# Patient Record
Sex: Female | Born: 1960 | Race: White | Hispanic: No | Marital: Married | Smoking: Never smoker
Health system: Southern US, Community
[De-identification: ages and names within clinical notes are randomized; demographics above are authoritative.]

## PROBLEM LIST (undated history)

## (undated) DIAGNOSIS — K219 Gastro-esophageal reflux disease without esophagitis: Secondary | ICD-10-CM

## (undated) DIAGNOSIS — G4700 Insomnia, unspecified: Secondary | ICD-10-CM

## (undated) DIAGNOSIS — E7800 Pure hypercholesterolemia, unspecified: Secondary | ICD-10-CM

## (undated) DIAGNOSIS — E079 Disorder of thyroid, unspecified: Secondary | ICD-10-CM

## (undated) HISTORY — DX: Disorder of thyroid, unspecified: E07.9

## (undated) HISTORY — PX: CHOLECYSTECTOMY: SHX55

## (undated) HISTORY — DX: Pure hypercholesterolemia, unspecified: E78.00

## (undated) HISTORY — PX: VAGINAL HYSTERECTOMY: SUR661

## (undated) HISTORY — DX: Gastro-esophageal reflux disease without esophagitis: K21.9

## (undated) HISTORY — PX: SINOSCOPY: SHX187

## (undated) HISTORY — PX: OTHER SURGICAL HISTORY: SHX169

## (undated) HISTORY — PX: OOPHORECTOMY: SHX86

## (undated) HISTORY — DX: Insomnia, unspecified: G47.00

## (undated) HISTORY — PX: APPENDECTOMY: SHX54

---

## 1998-06-29 ENCOUNTER — Other Ambulatory Visit: Admission: RE | Admit: 1998-06-29 | Discharge: 1998-06-29 | Payer: Self-pay

## 1999-09-07 ENCOUNTER — Encounter (INDEPENDENT_AMBULATORY_CARE_PROVIDER_SITE_OTHER): Payer: Self-pay | Admitting: *Deleted

## 1999-09-07 ENCOUNTER — Ambulatory Visit (HOSPITAL_BASED_OUTPATIENT_CLINIC_OR_DEPARTMENT_OTHER): Admission: RE | Admit: 1999-09-07 | Discharge: 1999-09-08 | Payer: Self-pay | Admitting: Otolaryngology

## 1999-10-04 ENCOUNTER — Ambulatory Visit: Admission: RE | Admit: 1999-10-04 | Discharge: 1999-10-04 | Payer: Self-pay | Admitting: Otolaryngology

## 2000-12-09 ENCOUNTER — Other Ambulatory Visit: Admission: RE | Admit: 2000-12-09 | Discharge: 2000-12-09 | Payer: Self-pay | Admitting: Gynecology

## 2000-12-10 ENCOUNTER — Encounter: Admission: RE | Admit: 2000-12-10 | Discharge: 2000-12-10 | Payer: Self-pay | Admitting: Gynecology

## 2000-12-10 ENCOUNTER — Encounter: Payer: Self-pay | Admitting: Gynecology

## 2001-01-22 ENCOUNTER — Encounter (INDEPENDENT_AMBULATORY_CARE_PROVIDER_SITE_OTHER): Payer: Self-pay

## 2001-01-22 ENCOUNTER — Observation Stay (HOSPITAL_COMMUNITY): Admission: RE | Admit: 2001-01-22 | Discharge: 2001-01-23 | Payer: Self-pay | Admitting: Gynecology

## 2003-12-22 ENCOUNTER — Other Ambulatory Visit: Admission: RE | Admit: 2003-12-22 | Discharge: 2003-12-22 | Payer: Self-pay | Admitting: Obstetrics and Gynecology

## 2005-04-27 ENCOUNTER — Inpatient Hospital Stay (HOSPITAL_COMMUNITY): Admission: RE | Admit: 2005-04-27 | Discharge: 2005-04-27 | Payer: Self-pay | Admitting: Obstetrics and Gynecology

## 2005-11-01 ENCOUNTER — Ambulatory Visit: Payer: Self-pay | Admitting: Family Medicine

## 2008-05-27 ENCOUNTER — Encounter: Admission: RE | Admit: 2008-05-27 | Discharge: 2008-05-27 | Payer: Self-pay | Admitting: Internal Medicine

## 2009-04-07 ENCOUNTER — Encounter: Admission: RE | Admit: 2009-04-07 | Discharge: 2009-04-07 | Payer: Self-pay | Admitting: Obstetrics and Gynecology

## 2010-09-05 ENCOUNTER — Ambulatory Visit (HOSPITAL_COMMUNITY): Admission: RE | Admit: 2010-09-05 | Discharge: 2010-09-05 | Payer: Self-pay | Admitting: Family Medicine

## 2011-03-08 NOTE — Op Note (Signed)
[name], Tyler NO.:  [id number]   MEDICAL RECORD NO.:  [id number]          PATIENT TYPE:  AMB   LOCATION:  SDC                           FACILITY:  [facility]   PHYSICIAN:  Randye Lobo, M.D.   DATE OF BIRTH:  1961-04-27   DATE OF PROCEDURE:  04/26/2005  DATE OF DISCHARGE:                                 OPERATIVE REPORT   PREOPERATIVE DIAGNOSIS:  1.  Dyspareunia.  2.  Genuine stress incontinence.  3.  Pelvic organ prolapse.   POSTOPERATIVE DIAGNOSIS:  1.  Dyspareunia.  2.  Pelvic adhesions.  3.  Genuine stress incontinence.  4.  Pelvic organ prolapse.   PROCEDURE:  Laparoscopy with lysis of adhesions, tension-free vaginal tape,  cystoscopy, anterior colporrhaphy.   SURGEON:  Randye Lobo, M.D.   ASSISTANT:  Carrington Clamp, M.D.   ANESTHESIA:  General endotracheal.   IV FLUIDS:  1500 mL Ringer's lactate.   ESTIMATED BLOOD LOSS:  75 mL.   URINE OUTPUT:  200 mL   COMPLICATIONS:  None.   INDICATIONS FOR PROCEDURE:  The patient is a 51 year old gravida 3, para 3  female, status post total vaginal hysterectomy for adenomyosis, status post  laparoscopic lysis of adhesions and ablation of endometriosis and status  post laparoscopic left salpingo-oophorectomy who presents with leakage of  urine and painful intercourse. The patient had evidence of genuine stress  incontinence with the cystometric testing performed in the office. The  patient had a normal pelvic ultrasound showing a normal right tube and ovary  during her preoperative evaluation. The patient wished for surgical  evaluation and treatment of the pelvic pain and the genuine stress  incontinence and a plan was made to proceed with laparoscopy with possible  lysis of adhesions and possible right salpingo-oophorectomy, transvaginal  tension-free vaginal tape, cystoscopy, and the anterior colporrhaphy after  risks, benefits, and alternatives were discussed with her.   FINDINGS:   Examination under anesthesia demonstrated a first degree  cystocele. The cervix and uterus were noted to be absent. There was a small  rectocele appreciated. The patient had good vaginal apical support.   At the time of laparoscopy, the uterus, left tube, left ovary were noted to  be absent. There was some scarring in the region where the previous vaginal  cuff had been closed and the omentum was adherent to this. The right tube  and ovary were noted to be completely free. The right fallopian tube was  normal. The right ovary had two small cysts. One was a 1.5 cm simple cyst,  and the other appeared to be corpus luteum cyst. There appeared to be  evidence of previous sutures in the cul-de-sac which were consistent with  the patient's prior surgeries.   The liver and the stomach organs appeared to be normal. The gallbladder was  not visualized. There were no adhesions in the upper abdomen.   Cystoscopy demonstrated the absence of a foreign body in either the bladder  or the urethra. The bladder was visualized throughout 360 degrees and had a  normal bladder dome and trigone. Both of the ureters were noted to  be patent  after the injection of indigo carmine dye.   SPECIMENS:  None.   PROCEDURE:  The patient was reidentified in the preoperative hold area. She  received both TED hose and PAS stockings for DVT prophylaxis. The patient  received Ancef 1 gram IV for antibiotic prophylaxis. The patient was then  taken down to the operating room where general endotracheal anesthesia was  induced. She was then placed in the dorsal lithotomy position and the  abdomen and the vagina were sterilely prepped. The patient was catheterized  with a red rubber catheter. She was then sterilely draped.   The procedure began with a 1 cm umbilical incision which was created with  the scalpel. The incision was then carried down to the fascia using an Allis  clamp. A 10 mm trocar was then inserted directly  into the peritoneal cavity  and the laparoscope confirmed proper placement. The pneumoperitoneum was  achieved and the patient was then placed in the Trendelenburg position. A 5  mm incision was then placed in the right and left lower quadrants and 5 mm  trocars were placed under direct visualization of laparoscope. An inspection  of the pelvic and abdominal organs was performed and the findings are as  noted above. The adhesions of the omentum to the vaginal cuff were sharply  lysed and the area was then irrigated and suctioned and found to be  hemostatic. A piece of intercede was placed over the vaginal cuff to prevent  further adhesion formation.   This concluded the patient's the laparoscopic surgery. The trocars were  removed under visualization of the laparoscope. The pneumoperitoneum was  released, and the laparoscope and the at 10 mm umbilical trocar were removed  simultaneously. The incisions were then closed with subcuticular sutures of  3-0 plain and sterile bandages were placed over these incisions.   The patient was then placed in the high lithotomy position and the  suburethral sling was performed next. A Foley catheter was placed inside the  bladder. A weighted speculum was placed inside the vagina. The anterior  vaginal wall was marked in the midline with Allis clamps. The mucosa was  then injected with 1% lidocaine with 1:200,000 of epinephrine. The vaginal  mucosa was then incised vertically with the scalpel. The dissection of the  vaginal mucosa off of the underlying subvaginal tissue was performed sharply  using the Metzenbaum scissors bilaterally. The dissection was carried back  to the level of the pubic rami bilaterally. Hemostasis was created during  the dissection using monopolar cautery in the area overlying the bladder.   The TVT sites were then marked suprapubically approximately 2.5 cm to the right and to the left of the midline. The suprapubic incisions were  created  sharply with a scalpel. The right suprapubic needle passer was placed first  in a top-down fashion. The incision was placed through the right suprapubic  incision and was guided up through the endopelvic fascia to the right of the  patient's urethra on the ipsilateral side. The same procedure that was  performed on the right-hand side was then repeated on the left-hand side  with the left TVT abdominal needle passer. The Foley catheter was removed at  this time and cystoscopy was performed and the findings were as noted above.  The bladder was then emptied of all cystoscopy fluid and the TVT sling was  attached to the needle passers. The sling was then drawn up through the  incision suprapubically. The cystoscope was  then again placed one final time  for evaluation of the bladder and there was no evidence of a foreign body in  the bladder or the urethra. Indigo carmine dye was visualized coming from  both the ureters after it had been injected intravenously. The cystoscope  was withdrawn and the bladder was completely emptied and a Foley catheter  was once again placed in the bladder. Final tension on the TVT was adjusted  and the excess sling material was excised suprapubically. Hemostasis was  noted to be good at this time and the anterior colporrhaphy was performed by  placing vertical mattress sutures of 0 Vicryl. Excess vaginal mucosa was  then trimmed away and the anterior vaginal wall was closed with interrupted  sutures of 2-0 Vicryl.   The suprapubic incisions were closed with Dermabond. This concluded the  patient's procedure. There were no complications to the procedure. All  needle, instrument, sponge counts were correct. The patient is escorted to  the recovery room in stable and awake condition.       BES/MEDQ  D:  04/26/2005  T:  04/26/2005  Job:  [id number]

## 2011-03-08 NOTE — Discharge Summary (Signed)
Lauren Tyler, Lauren Tyler NO.:  [id number]   MEDICAL RECORD NO.:  [id number]          PATIENT TYPE:  INP   LOCATION:  9311                          FACILITY:  [facility]   PHYSICIAN:  Randye Lobo, M.D.   DATE OF BIRTH:  November 28, 1960   DATE OF ADMISSION:  04/26/2005  DATE OF DISCHARGE:  04/27/2005                                 DISCHARGE SUMMARY   ADMISSION DIAGNOSES:  1.  Dyspareunia.  2.  Genuine stress incontinence.  3.  Pelvic organ prolapse.   DISCHARGE DIAGNOSES:  Status post laparoscopy with lysis of adhesions,  tension-free vaginal tape sling, cystoscopy, anterior colporrhaphy.   SIGNIFICANT OPERATIONS AND PROCEDURES:  The patient underwent a laparoscopy  with lysis of adhesions, tension-free vaginal tape suburethral sling and  cystoscopy with anterior colporrhaphy on April 26, 2005 at the [facility] [place] under the direction of Dr. Conley Simmonds and with the  assistance of Dr. Carrington Clamp.   HOSPITAL COURSE:  The patient is a 50 year old gravida 3, para 3, Caucasian  female, status post total vaginal hysterectomy, status post laparoscopic  lysis of adhesions and ablation of endometriosis, and status post  laparoscopic left salpingo-oophorectomy for an ovarian cyst who presented to  the office with leakage of urine and painful intercourse. The patient had  evidence of a first degree cystocele in the office. Multichannel urodynamic  testing confirmed the presence of genuine stress incontinence.   The patient wished for both evaluation of her dyspareunia in addition to the  treatment of her urinary incontinence.   The patient was admitted on April 26, 2005 at which time she underwent a  laparoscopy with lysis of adhesions, a tension-free vaginal tape suburethral  sling and cystoscopy with anterior colporrhaphy. The surgery was  uncomplicated. Findings at laparoscopy included omentum which was adherent  to the vaginal cuff. The patient had a  normal right ovary with a small 1.5  cm corpus luteum and also simple cyst. Cystoscopy demonstrated the absence  of foreign bodies in the bladder urethra. The bladder was visualized for 360  degrees and the ureters were patent bilaterally.   Postoperatively, the patient had an unremarkable surgical recovery. She had  postoperative nausea which was successfully treated with antiemetics. Her  pain was initially controlled with a morphine PCA and Toradol which was  successfully converted over to Percocet and ibuprofen on postoperative day  #1. The patient was able to tolerate a regular diet prior to her discharge.  She was able to ambulate independently. She did receive both PAS stockings  and TED hose for DVT prophylaxis intraoperative and postoperatively.  The  patient's suprapubic incisions remained clean, dry and intact. She had  minimal vaginal bleeding during the hospitalization.   On postoperative day #1, the patient was able to void 350 cc with a postvoid  residual of 32 cc.   Her discharge hemoglobin was 12.4 and she was tolerating this well.   The patient was found to be in good condition and ready for discharge on  postoperative day #1.   DISCHARGE INSTRUCTIONS:  1.  Discharged to home.  2.  The patient will take Percocet 5 mg/325 mg one to two p.o. q.4-6h.      p.r.n. pain, ibuprofen 600 mg p.o. q.6h. p.r.n. pain.  3.  The patient will follow a regular diet.  4.  The patient will have decreased activity for the next six weeks.  5.  The patient will follow up in the office in four weeks.  6.  The patient will call if she experiences problems with fever, nausea and      vomiting, pain uncontrolled by her medication, active vaginal bleeding,      difficulty emptying her bladder, or any other concern.      Randye Lobo, M.D.  Electronically Signed     BES/MEDQ  D:  06/06/2005  T:  06/06/2005  Job:  [id number]

## 2011-03-08 NOTE — H&P (Signed)
NAME:  Lauren Tyler, Lauren Tyler NO.:  [id number]   MEDICAL RECORD NO.:  [id number]          PATIENT TYPE:  AMB   LOCATION:  SDC                           [facility]ACILITY:  [facility]   PHYSICIAN:  Randye Lobo, M.D.   DATE OF BIRTH:  1961/03/03   DATE OF ADMISSION:  DATE OF DISCHARGE:                                HISTORY & PHYSICAL   CHIEF COMPLAINT:  1.  Urinary incontinence.  2.  Painful intercourse.   HISTORY OF PRESENT ILLNESS:  The patient is a 50 year old, gravida 3, para  26, Caucasian female, status post total vaginal hysterectomy in 1995 for  adenomyosis, status post laparoscopic lysis of adhesions and ablation of  endometriosis in 1991, and status post laparoscopic left salpingo-  oophorectomy for a complex ovarian cyst in 2002, who presents with leakage  of urine and painful intercourse. The patient reports that her leakage of  urine occurs with coughing, laughing, and intercourse. She also reports pain  with intercourse and she would like surgical treatment of both of these. The  patient has had simple cystometrics performed in the office and her bladder  was filled to a maximum of 300 mL. There was no evidence of detrusor  instability. The patient did have leakage of urine with standing and a  Valsalva maneuver. The patient would like to keep her right ovary if it  appears normal laparoscopically.   PAST OBSTETRIC AND GYNECOLOGIC HISTORY:  1.  Three prior vaginal deliveries.  2.  Status post total vaginal hysterectomy in 1995.  3.  Status post laparoscopic lysis of adhesions and ablation of      endometriosis in 1991.  4.  Status post laparoscopic left salpingo-oophorectomy for a complex mass      which proved to be a corpus luteum cyst and adnexal adhesions.  5.  Normal Pap smear in March of 2005.  6.  Normal mammogram in April of 2006.   PAST MEDICAL HISTORY:  1.  Cirrhosis.  2.  Sleep apnea. The patient does not use a CPAP machine.  3.  Bursitis of the right  hip.  4.  Irritable bowel syndrome.  5.  Recent diagnosis of diverticulitis in March of 2006.   PAST SURGICAL HISTORY:  1.  Status post total vaginal hysterectomy.  2.  Status post laparoscopic lysis of adhesions and ablation of      endometriosis.  3.  Status post laparoscopic left salpingo-oophorectomy.  4.  Status post appendectomy.   MEDICATIONS:  1.  Nexium 40 mg p.o. daily.  2.  Aleve p.r.n.   ALLERGIES:  No known drug allergies.   SOCIAL HISTORY:  The patient is an Airline pilot. She denies the use of  tobacco, alcohol, or illicit drugs.   PHYSICAL EXAMINATION:  VITAL SIGNS:  Blood pressure is 140/90.  GENERAL:  The patient is a Caucasian female in no acute distress.  LUNGS:  Clear to auscultation bilaterally.  HEART:  S1, S2 with a regular rate and rhythm.  ABDOMEN:  Soft and nontender and without evidence of hepatosplenomegaly or  organomegaly.  PELVIC:  Normal external genitalia and urethra. There is a  first degree  cystocele, a first degree rectocele, and good vaginal apical support. The  cervix and uterus are noted to be absent. No midline or adnexal masses are  appreciated.   IMPRESSION:  The patient is a 50 year old, gravida 3, para 3 female with  dyspareunia, a history of endometriosis and pelvic adhesive disease, and  genuine stress incontinence.   PLAN:  The patient will undergo a laparoscopic lysis of adhesions with  possible right salpingo-oophorectomy, tension free vaginal tape procedure,  cystoscopy, and anterior colporrhaphy at the [facility] [place]  on April 26, 2005. The risks, benefits, and alternatives had been discussed  with the patient who wishes to proceed.       BES/MEDQ  D:  04/25/2005  T:  04/25/2005  Job:  [id number]

## 2011-03-08 NOTE — Op Note (Signed)
[facility]  Patient:    Lauren Tyler, Lauren Tyler                    MRN: [id number] Proc. Date: 01/22/01 Attending:  Luvenia Redden, M.D.                           Operative Report  PREOPERATIVE DIAGNOSIS:       Cystic pelvic mass.  POSTOPERATIVE DIAGNOSES:      1. Cystic pelvic mass.                               2. Pelvic adhesions.                               3. Small hydrosalpinx on the right.  OPERATIONS:                   1. Laparoscopy.                               2. Lysis of adhesions.                               3. Left salpingo-oophorectomy.  SURGEON:                      Luvenia Redden, M.D.  ASSISTANT:                    Miguel Aschoff, M.D.  DESCRIPTION OF PROCEDURE:     Under good anesthesia, the patient was prepped and draped in a sterile manner.  The bladder was catheterized with a Foley catheter.  A transverse incision at the lower border of the umbilicus was made.  A Veress needle was inserted intraperitoneally.  Placement was confirmed by a negative pressure reading with upward traction of the abdominal wall.  Pneumoperitoneum was formed with carbon dioxide and the needle was removed.  The laparoscopic trocar and cannula were introduced.  The trocar removed and the operative laparoscope introduced.  Under direct vision, an avascular site in the midline suprapubically was chosen for a 5 mm operative trocar and cannula.  This was introduced.  A blunt probe was introduced through this cannula.  The intestines were mobilized out of the pelvis.  The right ovary was normal size and shape and was freely movable.  There was one small area of hydrosalpinx in the end of the tube.  It was freely movable and was really of no consequence.  There was no evidence of endometriosis.  The uterus, of course, was missing due to previous hysterectomy.  On the right side, there was a cystic mass in the ovary that was about 4 cm.  For the most part,  the ovary was freely movable.  The surface was shiny down next to the vagina.  That ______ area was adherent to the peritoneum.  Two more operative ports were introduced, both in the lower quadrants through an avascular site by first transilluminating the abdominal wall to miss the vessels.  The 5 mm cannula in the midline was replaced with a 10/12 mm cannula.  The infundibulopelvic ligament was grasped right at the insertion to the ovary and tented  up.  The ureter was viewed down in the pelvis well below the operative site.  Using the tripolar forceps, the infundibulopelvic ligament was grasped, cauterized to 0 in two places and incised so as to free up the ovary from its attachment to the ligament.  This was done in two bites.  Dissection was carried down into the mesentery by cauterizing and cutting it.  Once we got down to the level of the broad ligament, there were some adhesions of the ovary to the peritoneum.  These adhesions was lysed by sharp and blunt dissection until the specimen could be freed up entirely.  During dissection, the cyst that was present in the ovary was ruptured.  There was clear fluid in it.  The cyst wall internally was smooth and there were no excrescences or internal structures.  Once the ovary was freed up, there were a few small bleeders in the bed where it was adherent.  These were electrocoagulated with the bipolar forceps.  The area was irrigated copiously with lactated Ringers and then aspirated.  Both ureters were viewed again before finishing the procedure.  They were both seen to peristalse normally.  The specimen was put into an Endobag and brought out through the midline puncture site.  The area was again irrigated.  Small individual bleeders right on the vaginal apex were electrocoagulated.  Under reduced pressure, there was no active bleeding in the operative site.  The procedure was then terminated.  The pneumoperitoneum was released and the  puncture sites were viewed under reduced pressure and there was no bleeding from these sites.  The fascia was closed in the midline incision suprapubically with 0 Vicryl, two sutures.  The other incisions were closed subcuticularly with 2-0 plain catgut suture.  Band-aids were applied. Total operating time was approximately two hours due to the tedious dissection of getting the adherent ovary away from the peritoneum.  Estimated blood loss was less than 50 cc.  None was replaced.  The patient had clear urine coming from her Foley at the end of the procedure.  She was removed to recovery in good condition. DD:  01/21/99 TD:  01/22/01 Job: [id number]

## 2011-04-21 DEATH — deceased

## 2011-05-22 ENCOUNTER — Other Ambulatory Visit: Payer: Self-pay | Admitting: Obstetrics and Gynecology

## 2011-05-28 ENCOUNTER — Other Ambulatory Visit: Payer: Self-pay | Admitting: Obstetrics and Gynecology

## 2011-05-28 DIAGNOSIS — R928 Other abnormal and inconclusive findings on diagnostic imaging of breast: Secondary | ICD-10-CM

## 2011-06-04 ENCOUNTER — Ambulatory Visit
Admission: RE | Admit: 2011-06-04 | Discharge: 2011-06-04 | Disposition: A | Discharge status: Home / Self Care | Payer: 59 | Source: Ambulatory Visit | Attending: Obstetrics and Gynecology | Admitting: Obstetrics and Gynecology

## 2011-06-04 DIAGNOSIS — R928 Other abnormal and inconclusive findings on diagnostic imaging of breast: Secondary | ICD-10-CM

## 2011-09-17 ENCOUNTER — Other Ambulatory Visit: Payer: Self-pay | Admitting: Obstetrics and Gynecology

## 2011-09-17 DIAGNOSIS — N63 Unspecified lump in unspecified breast: Secondary | ICD-10-CM

## 2011-09-27 ENCOUNTER — Ambulatory Visit
Admission: RE | Admit: 2011-09-27 | Discharge: 2011-09-27 | Disposition: A | Discharge status: Home / Self Care | Payer: 59 | Source: Ambulatory Visit | Attending: Obstetrics and Gynecology | Admitting: Obstetrics and Gynecology

## 2011-09-27 DIAGNOSIS — N63 Unspecified lump in unspecified breast: Secondary | ICD-10-CM

## 2012-02-17 ENCOUNTER — Other Ambulatory Visit: Payer: Self-pay | Admitting: Obstetrics and Gynecology

## 2012-02-17 DIAGNOSIS — N63 Unspecified lump in unspecified breast: Secondary | ICD-10-CM

## 2012-03-26 ENCOUNTER — Ambulatory Visit
Admission: RE | Admit: 2012-03-26 | Discharge: 2012-03-26 | Disposition: A | Discharge status: Home / Self Care | Payer: 59 | Source: Ambulatory Visit | Attending: Obstetrics and Gynecology | Admitting: Obstetrics and Gynecology

## 2012-03-26 DIAGNOSIS — N63 Unspecified lump in unspecified breast: Secondary | ICD-10-CM

## 2012-05-28 ENCOUNTER — Other Ambulatory Visit: Payer: Self-pay | Admitting: Internal Medicine

## 2012-05-28 DIAGNOSIS — D249 Benign neoplasm of unspecified breast: Secondary | ICD-10-CM

## 2012-05-28 DIAGNOSIS — Z1231 Encounter for screening mammogram for malignant neoplasm of breast: Secondary | ICD-10-CM

## 2012-06-08 ENCOUNTER — Ambulatory Visit: Payer: 59

## 2012-06-12 ENCOUNTER — Ambulatory Visit
Admission: RE | Admit: 2012-06-12 | Discharge: 2012-06-12 | Disposition: A | Discharge status: Home / Self Care | Payer: 59 | Source: Ambulatory Visit | Attending: Internal Medicine | Admitting: Internal Medicine

## 2012-06-12 DIAGNOSIS — D249 Benign neoplasm of unspecified breast: Secondary | ICD-10-CM

## 2013-06-15 ENCOUNTER — Other Ambulatory Visit: Payer: Self-pay

## 2014-07-20 ENCOUNTER — Other Ambulatory Visit: Payer: Self-pay

## 2014-07-21 LAB — CYTOLOGY - PAP

## 2015-04-03 ENCOUNTER — Other Ambulatory Visit: Payer: Self-pay | Admitting: Pathology

## 2015-07-26 ENCOUNTER — Other Ambulatory Visit: Payer: Self-pay | Admitting: Obstetrics and Gynecology

## 2015-07-26 DIAGNOSIS — N6320 Unspecified lump in the left breast, unspecified quadrant: Principal | ICD-10-CM

## 2015-07-26 DIAGNOSIS — N6325 Unspecified lump in the left breast, overlapping quadrants: Secondary | ICD-10-CM

## 2015-08-02 ENCOUNTER — Other Ambulatory Visit: Payer: Self-pay

## 2015-08-09 ENCOUNTER — Other Ambulatory Visit: Payer: Self-pay

## 2015-08-16 ENCOUNTER — Ambulatory Visit
Admission: RE | Admit: 2015-08-16 | Discharge: 2015-08-16 | Disposition: A | Discharge status: Home / Self Care | Payer: BLUE CROSS/BLUE SHIELD | Source: Ambulatory Visit | Attending: Obstetrics and Gynecology | Admitting: Obstetrics and Gynecology

## 2015-08-16 DIAGNOSIS — N6320 Unspecified lump in the left breast, unspecified quadrant: Principal | ICD-10-CM

## 2015-08-16 DIAGNOSIS — N6325 Unspecified lump in the left breast, overlapping quadrants: Secondary | ICD-10-CM

## 2018-06-16 ENCOUNTER — Ambulatory Visit (INDEPENDENT_AMBULATORY_CARE_PROVIDER_SITE_OTHER): Payer: PRIVATE HEALTH INSURANCE | Admitting: Allergy

## 2018-06-16 ENCOUNTER — Encounter: Payer: Self-pay | Admitting: Allergy

## 2018-06-16 VITALS — BP 114/72 | HR 92 | Temp 98.5°F | Resp 20 | Ht 67.0 in | Wt 233.6 lb

## 2018-06-16 DIAGNOSIS — J310 Chronic rhinitis: Secondary | ICD-10-CM | POA: Diagnosis not present

## 2018-06-16 DIAGNOSIS — J3089 Other allergic rhinitis: Secondary | ICD-10-CM | POA: Diagnosis not present

## 2018-06-16 MED ORDER — IPRATROPIUM BROMIDE 0.06 % NA SOLN: NASAL | 5 refills | Status: AC

## 2018-06-16 NOTE — Progress Notes (Signed)
New Patient Note  RE: Lauren Tyler MRN: [id number] DOB: 1960/11/04 Date of Office Visit: 06/16/2018  Referring provider: Angelina Sheriff, MD Primary care provider: Ronita Hipps, MD  Chief Complaint: allergies  History of present illness: Lauren Tyler is a 57 y.o. female presenting today for consultation for rhinitis.   She states she was told by her PCP that she needs to do something about her allergies.  She has had year-round allergy symptoms her "whole life".  She reports post-nasal drip, sneezing, nasal congestion, ear itchiness.  Spring is worse for her but symptoms are year-round.  She has taken [facility] allergy and sinus medication mostly in the spring or when symptoms are severe like when she feels sinus pressure.  She states she is supposed to use flonase but states has not been using like she should but states it has helped with congestion.  She states she did have allergy testing by bloodwork done by ENT about 1 year or so ago that was negative.  She denies having sinus infections.  She does report having sinus surgery about 25 years ago where she has a deviated septum repair and sinuses cleaned out.    She reports when she eats bread (mostly yeast/white bread not wheat) she has increased nasal drainage and cough.  This has been ongoing for years now and does not remember when this started.   She had endoscopy and colonoscopy by GI about 2 weeks ago and reports she required "stretching" of her esophagus.   No history of asthma.  She does have a history of psoriasis that was stress-induced.   She does take pantaprozole daily for reflux.    Review of systems: Review of Systems  Constitutional: Negative for chills, fever and malaise/fatigue.  HENT: Positive for congestion. Negative for ear discharge, ear pain, nosebleeds and sore throat.   Eyes: Negative for pain, discharge and redness.  Respiratory: Negative for cough, sputum production, shortness of breath and  wheezing.   Cardiovascular: Negative for chest pain.  Gastrointestinal: Negative for abdominal pain, constipation, diarrhea, heartburn, nausea and vomiting.  Musculoskeletal: Negative for joint pain.  Skin: Negative for itching and rash.  Neurological: Negative for headaches.    All other systems negative unless noted above in HPI  Past medical history: Past Medical History:  Diagnosis Date  . GERD (gastroesophageal reflux disease)   . Hypercholesteremia   . Insomnia   . Thyroid disorder     Past surgical history: Past Surgical History:  Procedure Laterality Date  . APPENDECTOMY    . CHOLECYSTECTOMY    . OOPHORECTOMY Left   . pelvic floor reconstruction    . SINOSCOPY    . VAGINAL HYSTERECTOMY      Family history:  Family History  Problem Relation Age of Onset  . Diabetes Father   . Congestive Heart Failure Father   . Hypertension Father   . Hypertension Maternal Grandfather   . Hypertension Paternal Grandmother   . Diabetes Paternal Grandmother   . Hypertension Paternal Grandfather   . Allergic rhinitis Neg Hx   . Asthma Neg Hx     Social history: Lives in a home with carpeting with gas heating and window cooling.  Dogs and cat in the home.  No concern for roaches in the home.  She works as a Dance movement psychotherapist.  She denies smoking history.    Medication List: Allergies as of 06/16/2018   No Known Allergies     Medication List  Accurate as of 06/16/18  4:59 PM. Always use your most recent med list.          amitriptyline 25 MG tablet Commonly known as:  ELAVIL TAKE 1 TABLET BY MOUTH EVERY DAY AT NIGHT   amLODipine 5 MG tablet Commonly known as:  NORVASC Take 5 mg by mouth daily.   CALCIUM + D PO Take by mouth.   cyclobenzaprine 5 MG tablet Commonly known as:  FLEXERIL cyclobenzaprine 5 mg tablet  TAKE 1 (ONE) TABLET AT BEDTIME FOR JAW CLENCHING   FLUoxetine 10 MG capsule Commonly known as:  PROZAC Take 10 mg by mouth daily.   fluticasone 50  MCG/ACT nasal spray Commonly known as:  FLONASE fluticasone propionate 50 mcg/actuation nasal spray,suspension  USE 2 SPRAYS BY NASAL ROUTE DAILY FOR 30 DAYS.   losartan 50 MG tablet Commonly known as:  COZAAR Take 50 mg by mouth daily.   meloxicam 15 MG tablet Commonly known as:  MOBIC TAKE 1 TABLET BY MOUTH EVERY DAY. REPLACES DICLOFENAC   pantoprazole 40 MG tablet Commonly known as:  PROTONIX Take 40 mg by mouth daily.   simvastatin 20 MG tablet Commonly known as:  ZOCOR Take 20 mg by mouth daily.   VYVANSE 70 MG capsule Generic drug:  lisdexamfetamine Take 70 mg by mouth daily.   zolpidem 5 MG tablet Commonly known as:  AMBIEN TAKE 1 TABLET BY MOUTH AT BEDTIME       Known medication allergies: No Known Allergies   Physical examination: Blood pressure 114/72, pulse 92, temperature 98.5 F (36.9 C), temperature source Oral, resp. rate 20, height 5\' 7"  (1.702 m), weight 233 lb 9.6 oz (106 kg).  General: Alert, interactive, in no acute distress. HEENT: PERRLA, TMs pearly gray, turbinates moderately edematous with clear discharge, post-pharynx non erythematous. Neck: Supple without lymphadenopathy. Lungs: Clear to auscultation without wheezing, rhonchi or rales. {no increased work of breathing. CV: Normal S1, S2 without murmurs. Abdomen: Nondistended, nontender. Skin: Warm and dry, without lesions or rashes. Extremities:  No clubbing, cyanosis or edema. Neuro:   Grossly intact.  Diagnositics/Labs:  Allergy testing: environmental allergy skin prick testing is positive to meadow fescue grass pollen, mugwort weed pollen, elm tree pollen and mouse.  Intradermal testing is positive to mold mix 1,2,3,4 and cat. Yeast skin prick testing is negative Allergy testing results were read and interpreted by provider, documented by clinical staff.   Assessment and plan:   Allergic rhinitis   - environmental allergy skin testing today is positive to grass pollen, weed  pollen, tree pollen, mouse, cat and molds   - allergen avoidance measures discussed/handouts provided   - take long-acting antihistamine like Zyrtec 10mg , Xyzal 5mg  or Allegra 180mg  daily   - trial dymista 1 spray each nostril twice a day.  This is a combination nasal spray with Flonase + Astelin (nasal antihistamine).  This helps with both nasal congestion and drainage. If your insurance does not cover this spray will prescribe Astelin separately for you.    Gustatory rhinitis   - some foods can cause increased nasal congestion and drainage following ingestion.    - yeast IgE skin testing today is negative thus you are not allergic to yeast   - nasal atrovent can be helpful in decrease/stopping symptoms related to gustatory rhinitis.  Can use nasal atrovent 2 sprays each nostril either before bread ingestion to prevent symptoms or afterwards to control symptoms.    Follow-up 6 months or sooner if needed  I appreciate  the opportunity to take part in [place] care. Please do not hesitate to contact me with questions.  Sincerely,   Prudy Feeler, MD Allergy/Immunology [facility]

## 2018-06-16 NOTE — Patient Instructions (Addendum)
Allergic rhinitis   - environmental allergy skin testing today is positive to grass pollen, weed pollen, tree pollen, mouse, cat and molds   - allergen avoidance measures discussed/handouts provided   - take long-acting antihistamine like Zyrtec 10mg , Xyzal 5mg  or Allegra 180mg  daily   - trial dymista 1 spray each nostril twice a day.  This is a combination nasal spray with Flonase + Astelin (nasal antihistamine).  This helps with both nasal congestion and drainage. If your insurance does not cover this spray will prescribe Astelin separately for you.    Gustatory rhinitis   - some foods can cause increased nasal congestion and drainage following ingestion.    - yeast IgE skin testing today is negative thus you are not allergic to yeast   - nasal atrovent can be helpful in decrease/stopping symptoms related to gustatory rhinitis.  Can use nasal atrovent 2 sprays each nostril either before bread ingestion to prevent symptoms or afterwards to control symptoms.    Follow-up 6 months or sooner if needed

## 2018-07-22 ENCOUNTER — Other Ambulatory Visit: Payer: Self-pay | Admitting: *Deleted

## 2018-07-22 ENCOUNTER — Telehealth: Payer: Self-pay | Admitting: Allergy

## 2018-07-22 MED ORDER — AZELASTINE-FLUTICASONE 137-50 MCG/ACT NA SUSP: NASAL | 5 refills | Status: AC

## 2018-07-22 NOTE — Telephone Encounter (Signed)
Beverlie would like [facility] [place].  She states she really liked it from the sample given by Dr.  Nelva Bush.

## 2018-07-22 NOTE — Telephone Encounter (Signed)
RX sent

## 2018-11-27 ENCOUNTER — Other Ambulatory Visit: Payer: Self-pay

## 2019-04-29 ENCOUNTER — Other Ambulatory Visit: Payer: Self-pay | Admitting: Obstetrics and Gynecology

## 2019-04-29 DIAGNOSIS — N6320 Unspecified lump in the left breast, unspecified quadrant: Secondary | ICD-10-CM

## 2019-05-07 ENCOUNTER — Other Ambulatory Visit: Payer: Self-pay | Admitting: Obstetrics and Gynecology

## 2019-05-07 ENCOUNTER — Ambulatory Visit
Admission: RE | Admit: 2019-05-07 | Discharge: 2019-05-07 | Disposition: A | Discharge status: Home / Self Care | Payer: PRIVATE HEALTH INSURANCE | Source: Ambulatory Visit | Attending: Obstetrics and Gynecology | Admitting: Obstetrics and Gynecology

## 2019-05-07 ENCOUNTER — Ambulatory Visit
Admission: RE | Admit: 2019-05-07 | Discharge: 2019-05-07 | Disposition: A | Discharge status: Home / Self Care | Payer: BLUE CROSS/BLUE SHIELD | Source: Ambulatory Visit | Attending: Obstetrics and Gynecology | Admitting: Obstetrics and Gynecology

## 2019-05-07 DIAGNOSIS — N6320 Unspecified lump in the left breast, unspecified quadrant: Secondary | ICD-10-CM

## 2019-06-18 ENCOUNTER — Other Ambulatory Visit: Payer: Self-pay

## 2019-09-26 ENCOUNTER — Other Ambulatory Visit: Payer: Self-pay | Admitting: Allergy

## 2019-10-03 ENCOUNTER — Other Ambulatory Visit: Payer: Self-pay | Admitting: Allergy

## 2020-10-26 DIAGNOSIS — R079 Chest pain, unspecified: Secondary | ICD-10-CM

## 2021-05-14 IMAGING — US ULTRASOUND LEFT BREAST LIMITED
1 series · 2 of 2 positions shown · non-contrast
Comparison: 11/17/2018 and earlier

CLINICAL DATA: Palpable abnormality in the LEFT breast was noted 3
weeks ago.

EXAM:
DIGITAL DIAGNOSTIC LEFT MAMMOGRAM WITH CAD AND TOMO
ULTRASOUND LEFT BREAST

[Series 1: ultrasound left breast limited · 0.06mm/px · 2 of 2 slices shown]
[im 1/2]
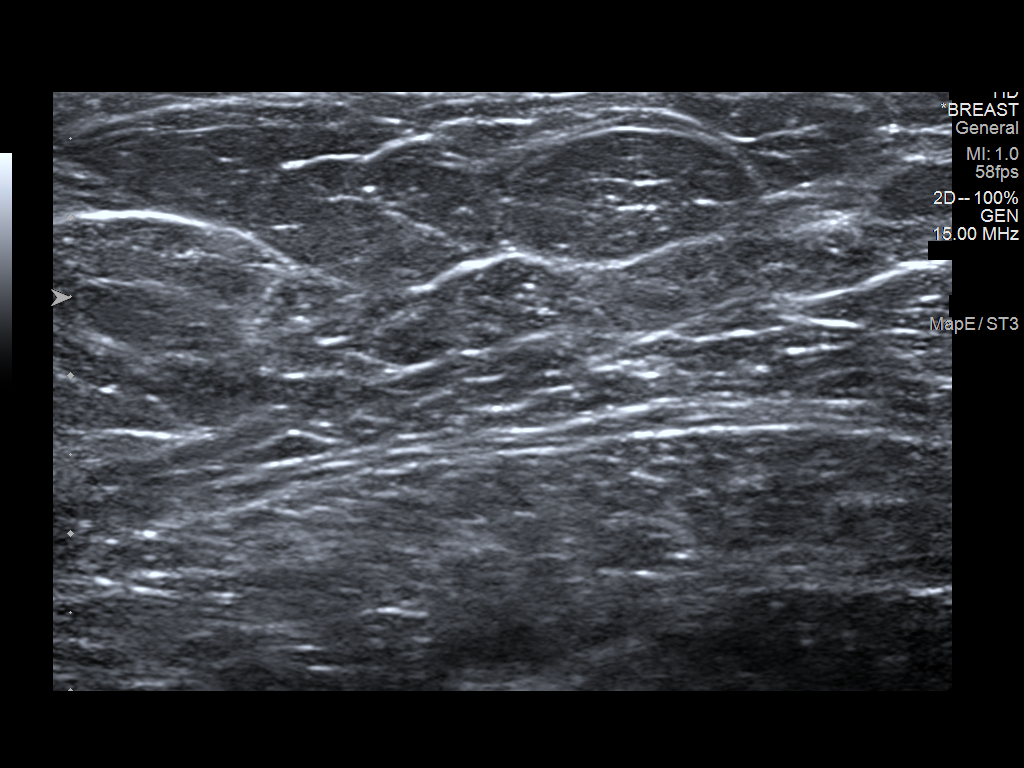
[im 2/2]
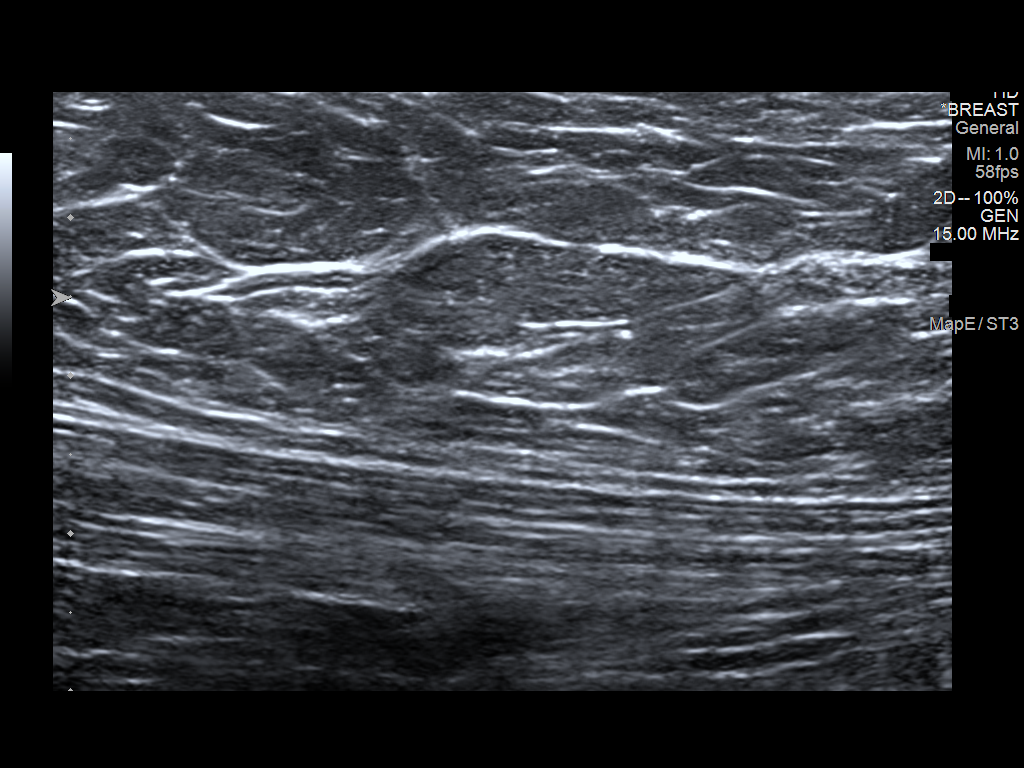

[2 of 2 positions shown; findings below may reference images not displayed]

ACR Breast Density Category b: There are scattered areas of
fibroglandular density.
FINDINGS: No suspicious mass, distortion, or microcalcifications are
identified to suggest presence of malignancy. Spot tangential view
in the area of patient's concern in the UPPER-OUTER QUADRANT of the
LEFT breast shows normal appearing fibrofatty tissue.

Mammographic images were processed with CAD.

On physical exam, I palpate no discrete mass in the UPPER portion of
the LEFT breast in the area concern to the patient. There is no
erythema or visible abnormality in the breast.

Targeted ultrasound is performed, showing normal appearing
fibrofatty tissue in the UPPER portion of the LEFT breast. Insert
present
IMPRESSION: No mammographic or ultrasound evidence for malignancy.

RECOMMENDATION:
Screening mammogram is recommended October 2019.

I have discussed the findings and recommendations with the patient.
Results were also provided in writing at the conclusion of the
visit. If applicable, a reminder letter will be sent to the patient
regarding the next appointment.

BI-RADS CATEGORY  1: Negative.

## 2021-05-14 IMAGING — MG DIGITAL DIAGNOSTIC UNILATERAL LEFT MAMMOGRAM WITH TOMO AND CAD
6 series · 6 of 18 positions shown · non-contrast
Comparison: 11/17/2018 and earlier

CLINICAL DATA: Palpable abnormality in the LEFT breast was noted 3
weeks ago.

EXAM:
DIGITAL DIAGNOSTIC LEFT MAMMOGRAM WITH CAD AND TOMO
ULTRASOUND LEFT BREAST

[L MLO synth-2D (1 of 2)]
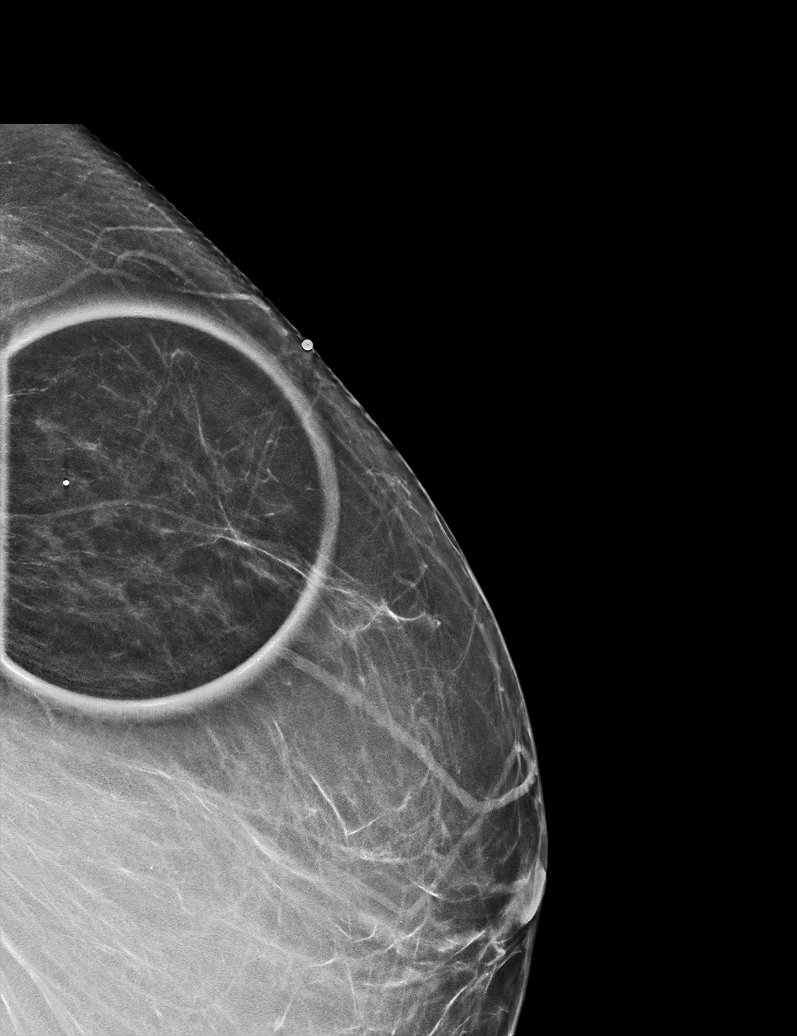

[L MLO synth-2D (2 of 2)]
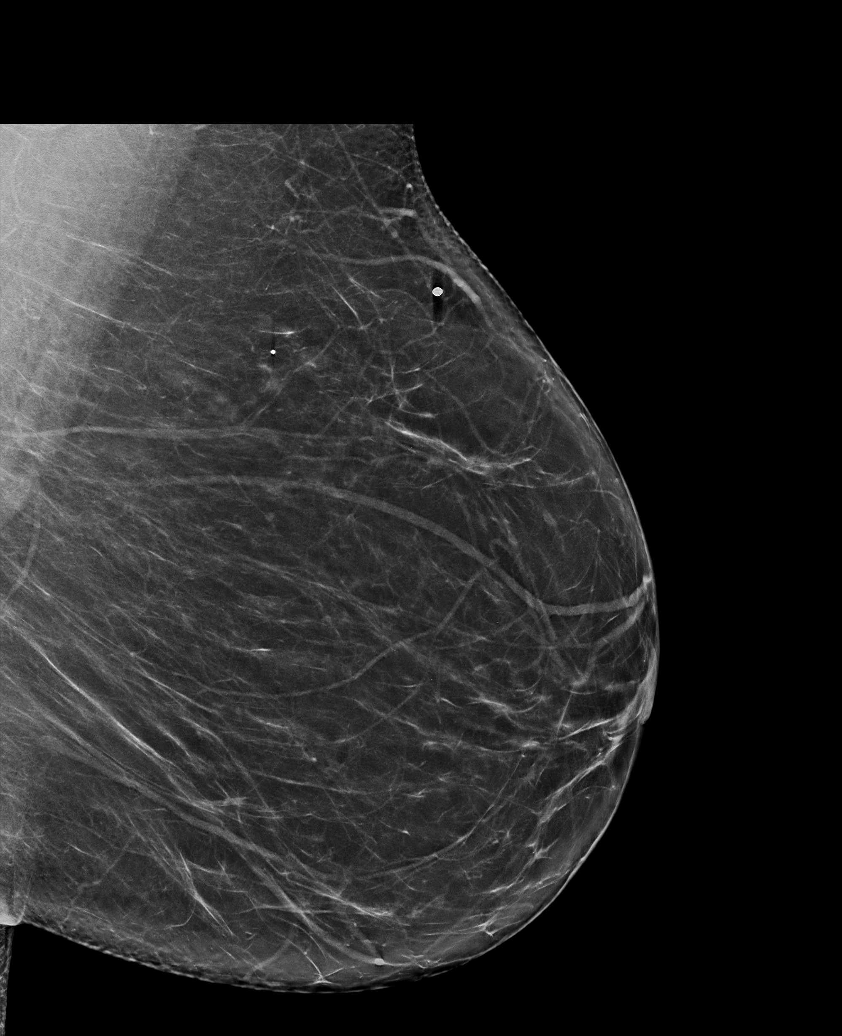

[L CC synth-2D]
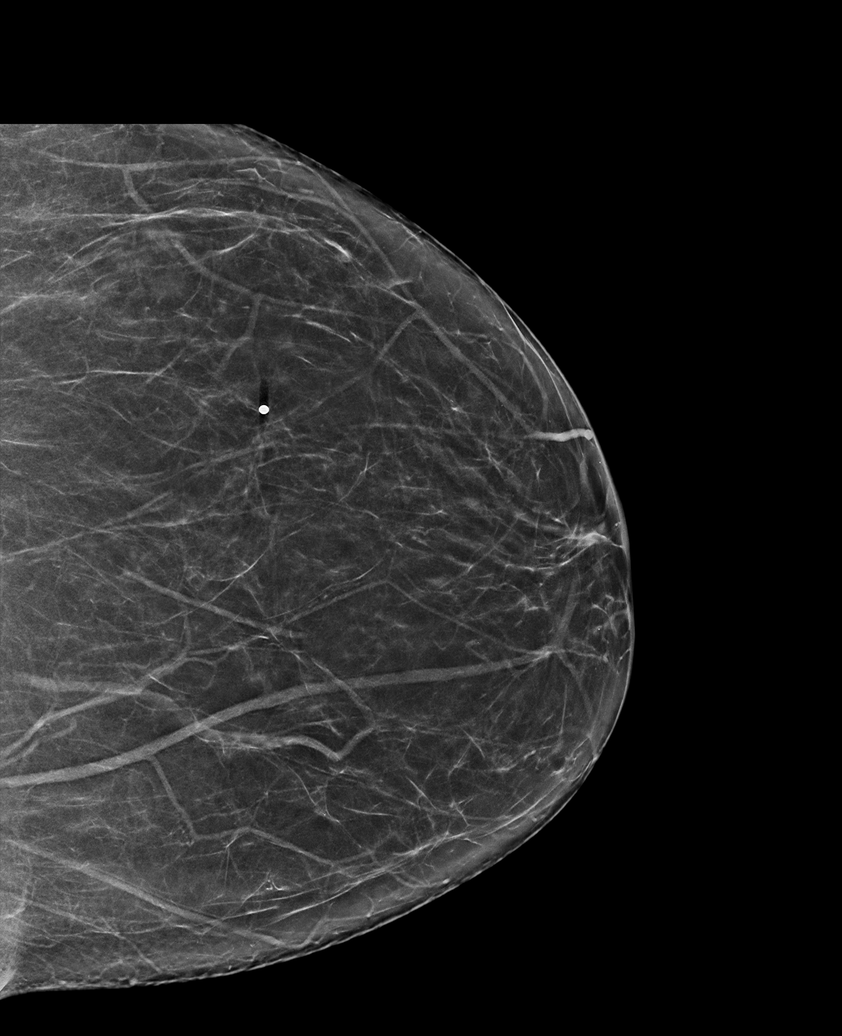

[L MLO tomo (1 of 2) · tomo slice 39/78.0]
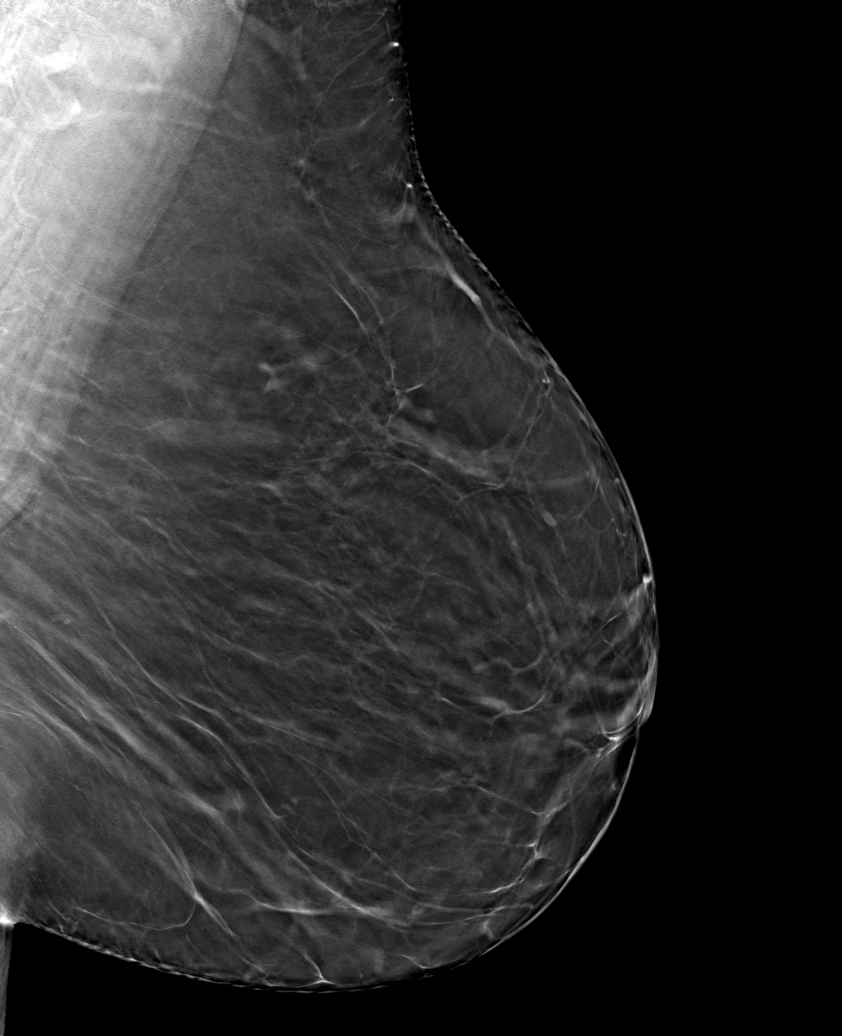

[L CC tomo · tomo slice 35/68.0]
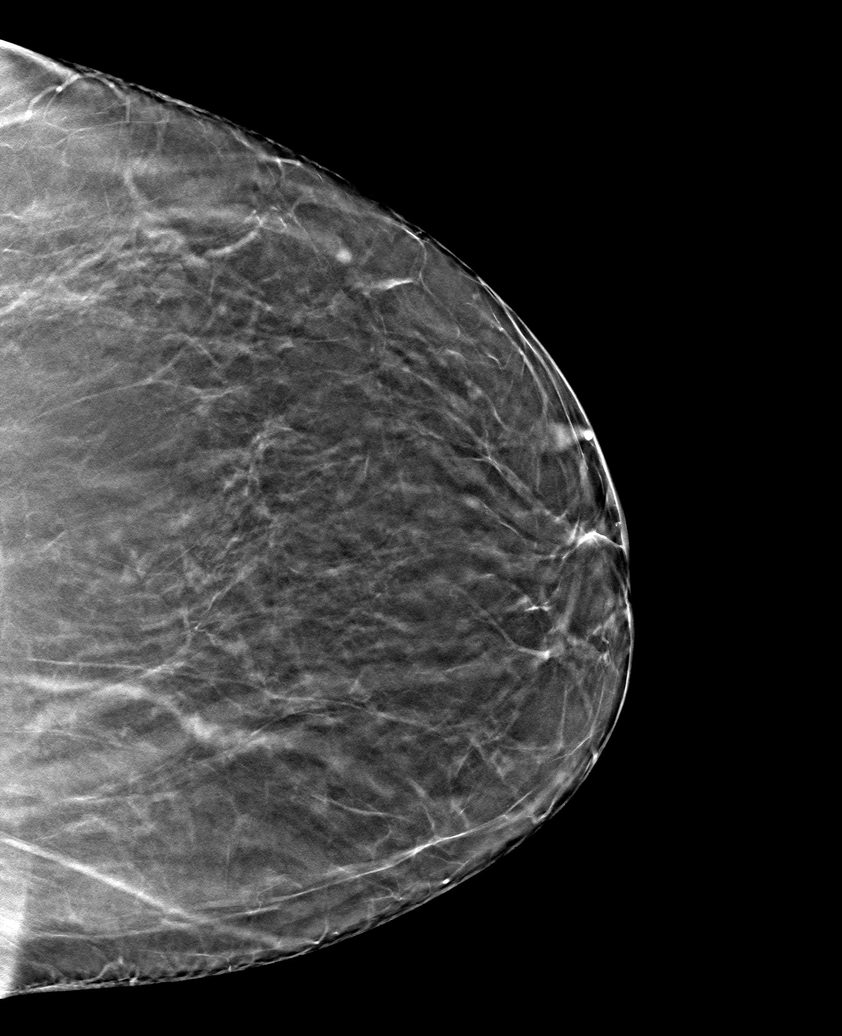

[L MLO tomo (2 of 2) · tomo slice 31/62.0]
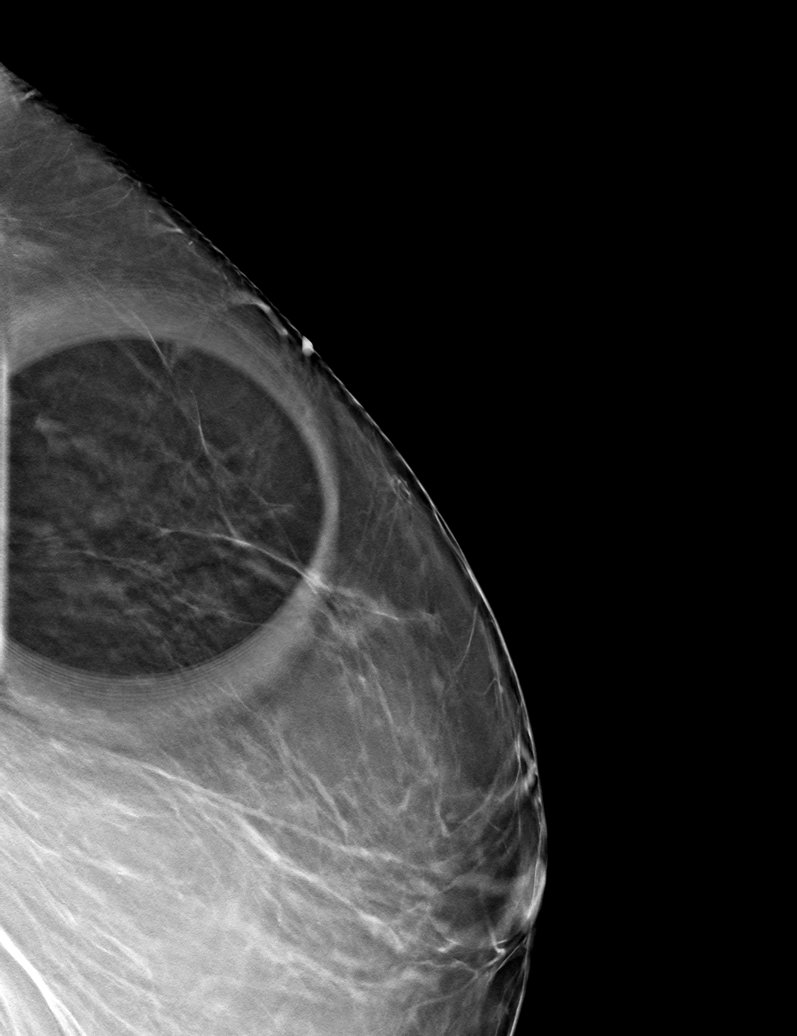

[6 of 18 positions shown; findings below may reference images not displayed]

ACR Breast Density Category b: There are scattered areas of
fibroglandular density.
FINDINGS: No suspicious mass, distortion, or microcalcifications are
identified to suggest presence of malignancy. Spot tangential view
in the area of patient's concern in the UPPER-OUTER QUADRANT of the
LEFT breast shows normal appearing fibrofatty tissue.

Mammographic images were processed with CAD.

On physical exam, I palpate no discrete mass in the UPPER portion of
the LEFT breast in the area concern to the patient. There is no
erythema or visible abnormality in the breast.

Targeted ultrasound is performed, showing normal appearing
fibrofatty tissue in the UPPER portion of the LEFT breast. Insert
present
IMPRESSION: No mammographic or ultrasound evidence for malignancy.

RECOMMENDATION:
Screening mammogram is recommended October 2019.

I have discussed the findings and recommendations with the patient.
Results were also provided in writing at the conclusion of the
visit. If applicable, a reminder letter will be sent to the patient
regarding the next appointment.

BI-RADS CATEGORY  1: Negative.

## 2022-06-28 ENCOUNTER — Emergency Department: Payer: PRIVATE HEALTH INSURANCE

## 2022-06-28 ENCOUNTER — Inpatient Hospital Stay
Admission: EM | Admit: 2022-06-28 | Discharge: 2022-06-30 | DRG: 603 | Disposition: A | Discharge status: Home / Self Care | Payer: PRIVATE HEALTH INSURANCE | Attending: Osteopathic Medicine | Admitting: Osteopathic Medicine

## 2022-06-28 ENCOUNTER — Encounter: Payer: Self-pay | Admitting: Emergency Medicine

## 2022-06-28 ENCOUNTER — Other Ambulatory Visit: Payer: Self-pay

## 2022-06-28 DIAGNOSIS — Z79899 Other long term (current) drug therapy: Secondary | ICD-10-CM

## 2022-06-28 DIAGNOSIS — F32A Depression, unspecified: Secondary | ICD-10-CM | POA: Diagnosis present

## 2022-06-28 DIAGNOSIS — L0390 Cellulitis, unspecified: Secondary | ICD-10-CM | POA: Diagnosis present

## 2022-06-28 DIAGNOSIS — Z9049 Acquired absence of other specified parts of digestive tract: Secondary | ICD-10-CM

## 2022-06-28 DIAGNOSIS — E079 Disorder of thyroid, unspecified: Secondary | ICD-10-CM | POA: Diagnosis present

## 2022-06-28 DIAGNOSIS — L03211 Cellulitis of face: Secondary | ICD-10-CM | POA: Diagnosis not present

## 2022-06-28 DIAGNOSIS — F458 Other somatoform disorders: Secondary | ICD-10-CM | POA: Diagnosis present

## 2022-06-28 DIAGNOSIS — I10 Essential (primary) hypertension: Secondary | ICD-10-CM | POA: Diagnosis not present

## 2022-06-28 DIAGNOSIS — G4700 Insomnia, unspecified: Secondary | ICD-10-CM | POA: Diagnosis present

## 2022-06-28 DIAGNOSIS — Z833 Family history of diabetes mellitus: Secondary | ICD-10-CM

## 2022-06-28 DIAGNOSIS — F419 Anxiety disorder, unspecified: Secondary | ICD-10-CM | POA: Diagnosis present

## 2022-06-28 DIAGNOSIS — E785 Hyperlipidemia, unspecified: Secondary | ICD-10-CM | POA: Diagnosis present

## 2022-06-28 DIAGNOSIS — D72829 Elevated white blood cell count, unspecified: Secondary | ICD-10-CM | POA: Diagnosis present

## 2022-06-28 DIAGNOSIS — E7800 Pure hypercholesterolemia, unspecified: Secondary | ICD-10-CM | POA: Diagnosis present

## 2022-06-28 DIAGNOSIS — G44209 Tension-type headache, unspecified, not intractable: Secondary | ICD-10-CM | POA: Diagnosis present

## 2022-06-28 DIAGNOSIS — Z90710 Acquired absence of both cervix and uterus: Secondary | ICD-10-CM

## 2022-06-28 DIAGNOSIS — Z8249 Family history of ischemic heart disease and other diseases of the circulatory system: Secondary | ICD-10-CM

## 2022-06-28 DIAGNOSIS — E782 Mixed hyperlipidemia: Secondary | ICD-10-CM

## 2022-06-28 DIAGNOSIS — K219 Gastro-esophageal reflux disease without esophagitis: Secondary | ICD-10-CM | POA: Diagnosis present

## 2022-06-28 DIAGNOSIS — D72828 Other elevated white blood cell count: Secondary | ICD-10-CM

## 2022-06-28 LAB — CBC WITH DIFFERENTIAL/PLATELET
Abs Immature Granulocytes: 0.07 10*3/uL (ref 0.00–0.07)
Basophils Absolute: 0 10*3/uL (ref 0.0–0.1)
Basophils Relative: 0 %
Eosinophils Absolute: 0.1 10*3/uL (ref 0.0–0.5)
Eosinophils Relative: 1 %
HCT: 42 % (ref 36.0–46.0)
Hemoglobin: 13.9 g/dL (ref 12.0–15.0)
Immature Granulocytes: 0 %
Lymphocytes Relative: 13 %
Lymphs Abs: 2.1 10*3/uL (ref 0.7–4.0)
MCH: 29.4 pg (ref 26.0–34.0)
MCHC: 33.1 g/dL (ref 30.0–36.0)
MCV: 88.8 fL (ref 80.0–100.0)
Monocytes Absolute: 1 10*3/uL (ref 0.1–1.0)
Monocytes Relative: 6 %
Neutro Abs: 13.4 10*3/uL — ABNORMAL HIGH (ref 1.7–7.7)
Neutrophils Relative %: 80 %
Platelets: 308 10*3/uL (ref 150–400)
RBC: 4.73 MIL/uL (ref 3.87–5.11)
RDW: 14.2 % (ref 11.5–15.5)
WBC: 16.7 10*3/uL — ABNORMAL HIGH (ref 4.0–10.5)
nRBC: 0 % (ref 0.0–0.2)

## 2022-06-28 LAB — COMPREHENSIVE METABOLIC PANEL
ALT: 29 U/L (ref 0–44)
AST: 37 U/L (ref 15–41)
Albumin: 4.2 g/dL (ref 3.5–5.0)
Alkaline Phosphatase: 110 U/L (ref 38–126)
Anion gap: 10 (ref 5–15)
BUN: 17 mg/dL (ref 8–23)
CO2: 24 mmol/L (ref 22–32)
Calcium: 9.5 mg/dL (ref 8.9–10.3)
Chloride: 105 mmol/L (ref 98–111)
Creatinine, Ser: 0.86 mg/dL (ref 0.44–1.00)
GFR, Estimated: >60 mL/min (ref >60)
Glucose, Bld: 102 mg/dL — ABNORMAL HIGH (ref 70–99)
Potassium: 3.7 mmol/L (ref 3.5–5.1)
Sodium: 139 mmol/L (ref 135–145)
Total Bilirubin: 0.6 mg/dL (ref 0.3–1.2)
Total Protein: 8.1 g/dL (ref 6.5–8.1)

## 2022-06-28 LAB — URINALYSIS, ROUTINE W REFLEX MICROSCOPIC
Bilirubin Urine: NEGATIVE
Glucose, UA: NEGATIVE mg/dL
Hgb urine dipstick: NEGATIVE
Ketones, ur: NEGATIVE mg/dL
Leukocytes,Ua: NEGATIVE
Nitrite: NEGATIVE
Protein, ur: NEGATIVE mg/dL
Specific Gravity, Urine: 1.015 (ref 1.005–1.030)
pH: 6 (ref 5.0–8.0)

## 2022-06-28 LAB — LACTIC ACID, PLASMA
Lactic Acid, Venous: 1.4 mmol/L (ref 0.5–1.9)
Lactic Acid, Venous: 1.5 mmol/L (ref 0.5–1.9)

## 2022-06-28 MED ORDER — SENNOSIDES-DOCUSATE SODIUM 8.6-50 MG PO TABS: 1.0000 | ORAL_TABLET | Freq: Every evening | ORAL | Status: DC | PRN

## 2022-06-28 MED ORDER — SODIUM CHLORIDE 0.9 % IV BOLUS
1000.0000 mL | Freq: Once | INTRAVENOUS | Status: AC
  Administered 2022-06-28: 1000 mL via INTRAVENOUS

## 2022-06-28 MED ORDER — MORPHINE SULFATE (PF) 2 MG/ML IV SOLN
2.0000 mg | INTRAVENOUS | Status: DC | PRN
  Administered 2022-06-28: 2 mg via INTRAVENOUS
  Filled 2022-06-28: qty 1

## 2022-06-28 MED ORDER — ACETAMINOPHEN 500 MG PO TABS: 1000.0000 mg | ORAL_TABLET | Freq: Once | ORAL | Status: AC

## 2022-06-28 MED ORDER — ONDANSETRON HCL 4 MG/2ML IJ SOLN: 4.0000 mg | Freq: Four times a day (QID) | INTRAMUSCULAR | Status: DC | PRN

## 2022-06-28 MED ORDER — PANTOPRAZOLE SODIUM 40 MG PO TBEC
40.0000 mg | DELAYED_RELEASE_TABLET | Freq: Every day | ORAL | Status: DC
  Administered 2022-06-28 – 2022-06-30 (×3): 40 mg via ORAL
  Filled 2022-06-28 (×3): qty 1

## 2022-06-28 MED ORDER — SODIUM CHLORIDE 0.9 % IV SOLN
2.0000 g | Freq: Three times a day (TID) | INTRAVENOUS | Status: DC
  Administered 2022-06-28 – 2022-06-29 (×4): 2 g via INTRAVENOUS
  Filled 2022-06-28 (×6): qty 12.5

## 2022-06-28 MED ORDER — HEPARIN SODIUM (PORCINE) 5000 UNIT/ML IJ SOLN: 5000.0000 [IU] | Freq: Three times a day (TID) | INTRAMUSCULAR | Status: DC

## 2022-06-28 MED ORDER — SIMVASTATIN 10 MG PO TABS
20.0000 mg | ORAL_TABLET | Freq: Every day | ORAL | Status: DC
  Administered 2022-06-28 – 2022-06-29 (×2): 20 mg via ORAL
  Filled 2022-06-28 (×2): qty 2

## 2022-06-28 MED ORDER — LIDOCAINE-PRILOCAINE 2.5-2.5 % EX CREA
TOPICAL_CREAM | Freq: Once | CUTANEOUS | Status: AC
Start: 2022-06-28 — End: 2022-06-28
  Filled 2022-06-28: qty 5

## 2022-06-28 MED ORDER — LACTATED RINGERS IV BOLUS (SEPSIS): 1000.0000 mL | Freq: Once | INTRAVENOUS | Status: DC

## 2022-06-28 MED ORDER — LISDEXAMFETAMINE DIMESYLATE 50 MG PO CAPS
50.0000 mg | ORAL_CAPSULE | Freq: Every day | ORAL | Status: DC
  Filled 2022-06-28 (×2): qty 1

## 2022-06-28 MED ORDER — AMLODIPINE BESYLATE 5 MG PO TABS
5.0000 mg | ORAL_TABLET | Freq: Every day | ORAL | Status: DC
  Administered 2022-06-29 – 2022-06-30 (×2): 5 mg via ORAL
  Filled 2022-06-28 (×2): qty 1

## 2022-06-28 MED ORDER — ENOXAPARIN SODIUM 60 MG/0.6ML IJ SOSY
0.5000 mg/kg | PREFILLED_SYRINGE | Freq: Every day | INTRAMUSCULAR | Status: DC
  Administered 2022-06-28: 50 mg via SUBCUTANEOUS
  Filled 2022-06-28: qty 0.6

## 2022-06-28 MED ORDER — ZOLPIDEM TARTRATE 5 MG PO TABS
10.0000 mg | ORAL_TABLET | Freq: Every evening | ORAL | Status: DC | PRN
  Administered 2022-06-28 – 2022-06-29 (×2): 10 mg via ORAL
  Filled 2022-06-28 (×2): qty 2

## 2022-06-28 MED ORDER — OXYCODONE-ACETAMINOPHEN 5-325 MG PO TABS
1.0000 | ORAL_TABLET | Freq: Four times a day (QID) | ORAL | Status: AC | PRN
  Administered 2022-06-28 – 2022-06-29 (×4): 1 via ORAL
  Filled 2022-06-28 (×4): qty 1

## 2022-06-28 MED ORDER — CYCLOBENZAPRINE HCL 10 MG PO TABS
5.0000 mg | ORAL_TABLET | Freq: Every day | ORAL | Status: DC
  Administered 2022-06-28 – 2022-06-29 (×2): 5 mg via ORAL
  Filled 2022-06-28 (×2): qty 1

## 2022-06-28 MED ORDER — PIPERACILLIN-TAZOBACTAM 3.375 G IVPB 30 MIN
3.3750 g | Freq: Once | INTRAVENOUS | Status: AC
  Administered 2022-06-28: 3.375 g via INTRAVENOUS
  Filled 2022-06-28: qty 50

## 2022-06-28 MED ORDER — ENOXAPARIN SODIUM 40 MG/0.4ML IJ SOSY: 40.0000 mg | PREFILLED_SYRINGE | Freq: Every day | INTRAMUSCULAR | Status: DC

## 2022-06-28 MED ORDER — FLUOXETINE HCL 10 MG PO CAPS
10.0000 mg | ORAL_CAPSULE | Freq: Every day | ORAL | Status: DC
  Administered 2022-06-29 – 2022-06-30 (×2): 10 mg via ORAL
  Filled 2022-06-28 (×2): qty 1

## 2022-06-28 MED ORDER — ALBUTEROL SULFATE (2.5 MG/3ML) 0.083% IN NEBU: 3.0000 mL | INHALATION_SOLUTION | RESPIRATORY_TRACT | Status: DC | PRN

## 2022-06-28 MED ORDER — ONDANSETRON HCL 4 MG PO TABS: 4.0000 mg | ORAL_TABLET | Freq: Four times a day (QID) | ORAL | Status: DC | PRN

## 2022-06-28 MED ORDER — ACETAMINOPHEN 500 MG PO TABS
ORAL_TABLET | ORAL | Status: AC
  Administered 2022-06-28: 1000 mg
  Filled 2022-06-28: qty 1

## 2022-06-28 MED ORDER — VANCOMYCIN HCL 2000 MG/400ML IV SOLN
2000.0000 mg | Freq: Once | INTRAVENOUS | Status: AC
  Administered 2022-06-28: 2000 mg via INTRAVENOUS
  Filled 2022-06-28: qty 400

## 2022-06-28 MED ORDER — HYDRALAZINE HCL 10 MG PO TABS: 10.0000 mg | ORAL_TABLET | Freq: Four times a day (QID) | ORAL | Status: DC | PRN

## 2022-06-28 MED ORDER — ACETAMINOPHEN 650 MG RE SUPP: 650.0000 mg | Freq: Four times a day (QID) | RECTAL | Status: DC | PRN

## 2022-06-28 MED ORDER — ACETAMINOPHEN 325 MG PO TABS
650.0000 mg | ORAL_TABLET | Freq: Four times a day (QID) | ORAL | Status: DC | PRN
  Administered 2022-06-28 – 2022-06-30 (×4): 650 mg via ORAL
  Filled 2022-06-28 (×4): qty 2

## 2022-06-28 MED ORDER — LOSARTAN POTASSIUM 50 MG PO TABS
75.0000 mg | ORAL_TABLET | Freq: Every day | ORAL | Status: DC
  Administered 2022-06-29 – 2022-06-30 (×2): 75 mg via ORAL
  Filled 2022-06-28 (×2): qty 2

## 2022-06-28 MED ORDER — IOHEXOL 300 MG/ML  SOLN
75.0000 mL | Freq: Once | INTRAMUSCULAR | Status: AC | PRN
  Administered 2022-06-28: 75 mL via INTRAVENOUS

## 2022-06-28 MED ORDER — VANCOMYCIN HCL IN DEXTROSE 1-5 GM/200ML-% IV SOLN
1000.0000 mg | Freq: Two times a day (BID) | INTRAVENOUS | Status: DC
  Administered 2022-06-28 – 2022-06-29 (×2): 1000 mg via INTRAVENOUS
  Filled 2022-06-28 (×4): qty 200

## 2022-06-28 NOTE — Assessment & Plan Note (Signed)
-   Fluoxetine 10 mg daily resumed

## 2022-06-28 NOTE — ED Triage Notes (Signed)
Pt via POV c/o left facial swelling/abscess since 9/5 am. Martin Majestic to dr and was prescribed amoxicillin; returned after 4 doses w/o improvement and was switched to clindamycin oral abx. Pt has taken 5 doses of clindamycin with no improvement in symptoms. Pt present with left-sided swelling and pain, redness to upper lip new since last night, extreme photosensitivity, nausea with 2 episodes of emesis yesterday (none today), and headache behind eyes. No dizziness, afebrile on arrival. Significant PMH includes IBS and HTN controlled with oral meds.

## 2022-06-28 NOTE — Assessment & Plan Note (Signed)
-   Resume zolpidem 10 mg nightly as needed for sleep

## 2022-06-28 NOTE — ED Provider Notes (Addendum)
[facility] Provider Note    Event Date/Time   First MD Initiated Contact with Patient 06/28/22 1046     (approximate)   History   Cellulitis   HPI  Lauren Tyler is a 61 y.o. female   Past medical history of history of hypercholesterolemia, who presents with facial infection that has been unresponsive to oral antibiotics.  Started earlier this week with a pimple inside of her left nasal passage with some redness and swelling that has progressed despite being on amoxicillin and transition to clindamycin.  Subjective fevers and chills.  Also has photophobia to the left eye.    History was obtained via patient and daughter at bedside      Physical Exam   Triage Vital Signs: ED Triage Vitals  Enc Vitals Group     BP 06/28/22 1037 (!) 118/48     Pulse Rate 06/28/22 1037 (!) 104     Resp 06/28/22 1037 18     Temp 06/28/22 1037 98 F (36.7 C)     Temp Source 06/28/22 1037 Oral     SpO2 06/28/22 1037 96 %     Weight 06/28/22 1038 225 lb (102.1 kg)     Height 06/28/22 1038 '5\' 7"'$  (1.702 m)     Head Circumference --      Peak Flow --      Pain Score 06/28/22 1042 7     Pain Loc --      Pain Edu? --      Excl. in [place]? --     Most recent vital signs: Vitals:   06/28/22 1230 06/28/22 1430  BP: 114/77 129/84  Pulse: 84 81  Resp: 17 20  Temp: 98.7 F (37.1 C)   SpO2: 96% 97%    General: Awake, no distress.  CV:  Good peripheral perfusion.  Resp:  Normal effort.  Abd:  No distention.  Other:  She has a small lesion to the anterior left nares which is draining purulence, scant, with some surrounding cellulitic changes on the upper lip and left cheek.  There is no oral involvement in her posterior oropharynx appears normal without masses and phonation is normal, maintaining secretions, no neck masses, no submandibular swelling.  nonToxic appearance.   ED Results / Procedures / Treatments   Labs (all labs ordered are listed, but only  abnormal results are displayed) Labs Reviewed  CBC WITH DIFFERENTIAL/PLATELET - Abnormal; Notable for the following components:      Result Value   WBC 16.7 (*)    Neutro Abs 13.4 (*)    All other components within normal limits  URINALYSIS, ROUTINE W REFLEX MICROSCOPIC - Abnormal; Notable for the following components:   Color, Urine YELLOW (*)    APPearance CLEAR (*)    All other components within normal limits  COMPREHENSIVE METABOLIC PANEL - Abnormal; Notable for the following components:   Glucose, Bld 102 (*)    All other components within normal limits  CULTURE, BLOOD (ROUTINE X 2)  CULTURE, BLOOD (ROUTINE X 2)  LACTIC ACID, PLASMA  LACTIC ACID, PLASMA  HIV ANTIBODY (ROUTINE TESTING W REFLEX)     I reviewed labs and they are notable for white blood cell count of 16.7  EKG  ED ECG REPORT I, Lucillie Garfinkel, the attending physician, personally viewed and interpreted this ECG.   Date: 06/28/2022  EKG Time: 1220  Rate: 82  Rhythm: normal EKG, normal sinus rhythm, unchanged from previous tracings  Axis: normal  Intervals: No ischemic changes   RADIOLOGY I independently reviewed and interpreted CT maxillofacial with contrast and see no drainable collection.   PROCEDURES:  Critical Care performed: No  Procedures   MEDICATIONS ORDERED IN ED: Medications  lactated ringers bolus 1,000 mL ( Intravenous Canceled Entry 06/28/22 1203)  heparin injection 5,000 Units (has no administration in time range)  senna-docusate (Senokot-S) tablet 1 tablet (has no administration in time range)  ondansetron (ZOFRAN) tablet 4 mg (has no administration in time range)    Or  ondansetron (ZOFRAN) injection 4 mg (has no administration in time range)  acetaminophen (TYLENOL) tablet 650 mg (has no administration in time range)    Or  acetaminophen (TYLENOL) suppository 650 mg (has no administration in time range)  vancomycin (VANCOCIN) IVPB 1000 mg/200 mL premix (has no administration in  time range)  ceFEPIme (MAXIPIME) 2 g in sodium chloride 0.9 % 100 mL IVPB (has no administration in time range)  oxyCODONE-acetaminophen (PERCOCET/ROXICET) 5-325 MG per tablet 1 tablet (has no administration in time range)  morphine (PF) 2 MG/ML injection 2 mg (has no administration in time range)  lidocaine-prilocaine (EMLA) cream ( Topical Given 06/28/22 1202)  piperacillin-tazobactam (ZOSYN) IVPB 3.375 g (0 g Intravenous Stopped 06/28/22 1235)  vancomycin (VANCOREADY) IVPB 2000 mg/400 mL (2,000 mg Intravenous New Bag/Given 06/28/22 1233)  acetaminophen (TYLENOL) tablet 1,000 mg (1,000 mg Oral Given 06/28/22 1148)  sodium chloride 0.9 % bolus 1,000 mL (0 mLs Intravenous Stopped 06/28/22 1259)  iohexol (OMNIPAQUE) 300 MG/ML solution 75 mL (75 mLs Intravenous Contrast Given 06/28/22 1319)    Consultants:  I spoke with hospitalist regarding admission and regarding care plan for this patient.   IMPRESSION / MDM / ASSESSMENT AND PLAN / ED COURSE  I reviewed the triage vital signs and the nursing notes.                              Differential diagnosis includes, but is not limited to, facial cellulitis, facial abscess sepsis, less likely [place]    The patient is on the cardiac monitor to evaluate for evidence of arrhythmia and/or significant heart rate changes.  MDM: Work-up as above significant for leukocytosis, no fever, hemodynamics appropriate and stable, with CT scan no drainable abscess.  Gave IV antibiotics vancomycin and Zosyn  Failure of p.o. antibiotics, will admit for IV antibiotics.   Patient's presentation is most consistent with acute presentation with potential threat to life or bodily function.       FINAL CLINICAL IMPRESSION(S) / ED DIAGNOSES   Final diagnoses:  Facial cellulitis     Rx / DC Orders   ED Discharge Orders     None        Note:  This document was prepared using Dragon voice recognition software and may include unintentional dictation errors.     Lucillie Garfinkel, MD 06/28/22 1542    Lucillie Garfinkel, MD 06/28/22 [phone]

## 2022-06-28 NOTE — H&P (Signed)
History and Physical   Lauren Tyler LDJ:[id number] DOB: 03/20/1961 DOA: 06/28/2022  PCP: Ronita Hipps, MD  Patient coming from: Home  I have personally briefly reviewed patient's old medical records in [place].  Chief Concern: Left-sided facial swelling  HPI: [name] is a 61 year old female with history of depression, anxiety, hypertension, GERD, hyperlipidemia, insomnia, presents to the emergency department for chief concerns of worsening left facial swelling.  Initial vitals in the emergency department showed temperature of 98, respiration rate of 18, heart rate of 104, SPO2 of 97% on room air, blood pressure 118/48.  Serum sodium is 139, potassium 3.7, chloride 105, bicarb 24, BUN of 17, serum creatinine of 0.86, GFR greater than 60, nonfasting blood glucose 102, WBC 16.7, hemoglobin 13.9, platelets of 308.  Lactic acid was 1.4.  CT maxillofacial with contrast was read as left intraoral orbital and lateral periorbital soft tissue swelling without associated fluid collection.  Slight asymmetric soft tissue swelling at the base of the nose.  No discrete fluid collection.  ED treatment: Acetaminophen 1 g IV, sodium chloride 1 L bolus, vancomycin, Zosyn IV per pharmacy.  At bedside patient was able to tell me her name, her age, she knows she is in the hospital and she knows the current calendar year.  She reports that she had a pimple in her left naris that started last week.  She endorses the pain and feels it is still there.  She reports on Tuesday, 06/25/2022, she developed swelling of her left cheek and left exterior nose with pain surrounding left side of her nose and her left cheek.  She reports the pain is 8 out of 10.  She endorses subjective fever with temperature of 99.  She denies chills.  She endorses 2 episodes of nausea and vomiting yesterday along with decreased p.o. intake.  She denies diarrhea, dysuria, hematuria, syncope, swelling of her legs.   She endorses tension headache along with light sensitivity that started today.  She reports that she has history of getting light sensitivity in the past.  She denies double vision.  Social history: She lives at home with her husband.  She denies tobacco, EtOH, recreational drug use.  She is currently working in [employer].  ROS: Constitutional: no weight change, + fever ENT/Mouth: no sore throat, no rhinorrhea Eyes: no eye pain, + light sensitivity Cardiovascular: no chest pain, no dyspnea,  no edema, no palpitations Respiratory: no cough, no sputum, no wheezing Gastrointestinal: no nausea, no vomiting, no diarrhea, no constipation Genitourinary: no urinary incontinence, no dysuria, no hematuria Musculoskeletal: no arthralgias, no myalgias, + left sided cheek pain Skin: no skin lesions, no pruritus, Neuro: + weakness, no loss of consciousness, no syncope Psych: no anxiety, no depression, + decrease appetite Heme/Lymph: no bruising, no bleeding  ED Course: Discussed with emergency medicine provider, patient requiring hospitalization for chief concerns of cellulitis of the left side failing outpatient therapy.  Assessment/Plan  Principal Problem:   Cellulitis Active Problems:   Depression   Leukocytosis   Essential hypertension   Hyperlipidemia   Insomnia   Bruxism   Assessment and Plan:  * Cellulitis Failed outpatient therapy - Per patient she is status post amoxicillin and clindamycin outpatient without improvement - She is status post Zosyn and vancomycin per EDP - We will continue with vancomycin and cefepime - Admit to MedSurg, observation  Bruxism - Resumed home cyclobenzaprine 5 mg nightly  Insomnia - Resume zolpidem 10 mg nightly as needed for sleep  Hyperlipidemia - Simvastatin 20 mg daily resumed  Essential hypertension - Resumed home amlodipine 5 mg daily, losartan 75 mg daily - Hydralazine 10 mg daily every 6 hours as needed for SBP greater than 180, 4  days ordered  Leukocytosis - Presumed secondary to cellulitis - Treat per cellulitis  Depression - Fluoxetine 10 mg daily resumed  Chart reviewed.   DVT prophylaxis: Enoxaparin Code Status: Full code Diet: Regular diet Family Communication: Updated daughters at bedside with patient's permission Disposition Plan: Pending clinical course Consults called: None at this time Admission status: MedSurg, observation  Past Medical History:  Diagnosis Date   GERD (gastroesophageal reflux disease)    Hypercholesteremia    Insomnia    Thyroid disorder    Past Surgical History:  Procedure Laterality Date   APPENDECTOMY     CHOLECYSTECTOMY     OOPHORECTOMY Left    pelvic floor reconstruction     SINOSCOPY     VAGINAL HYSTERECTOMY     Social History:  reports that she has never smoked. She has never used smokeless tobacco. No history on file for alcohol use and drug use.  No Known Allergies Family History  Problem Relation Age of Onset   Diabetes Father    Congestive Heart Failure Father    Hypertension Father    Hypertension Maternal Grandfather    Hypertension Paternal Grandmother    Diabetes Paternal Grandmother    Hypertension Paternal Grandfather    Allergic rhinitis Neg Hx    Asthma Neg Hx    Family history: Family history reviewed and not pertinent.  Prior to Admission medications   Medication Sig Start Date End Date Taking? Authorizing Provider  albuterol (VENTOLIN HFA) 108 (90 Base) MCG/ACT inhaler Inhale 2 puffs into the lungs every 4 (four) hours. 06/15/22   [provider]  amitriptyline (ELAVIL) 25 MG tablet TAKE 1 TABLET BY MOUTH EVERY DAY AT NIGHT 04/09/18   [provider]  amLODipine (NORVASC) 5 MG tablet Take 5 mg by mouth daily. 06/02/18   [provider]  Azelastine-Fluticasone (DYMISTA) 137-50 MCG/ACT SUSP Use one spray in each nostril twice daily as directed 07/22/18   Kennith Gain, MD  Calcium Citrate-Vitamin D  (CALCIUM + D PO) Take by mouth.    [provider]  cyclobenzaprine (FLEXERIL) 5 MG tablet cyclobenzaprine 5 mg tablet  TAKE 1 (ONE) TABLET AT BEDTIME FOR JAW CLENCHING    [provider]  FLUoxetine (PROZAC) 10 MG capsule Take 10 mg by mouth daily. 04/01/18   [provider]  fluticasone (FLONASE) 50 MCG/ACT nasal spray fluticasone propionate 50 mcg/actuation nasal spray,suspension  USE 2 SPRAYS BY NASAL ROUTE DAILY FOR 30 DAYS. 01/06/18   [provider]  ipratropium (ATROVENT) 0.06 % nasal spray Use two sprays in each nostril every 6 hours if needed to dry up nose 06/16/18   Kennith Gain, MD  losartan (COZAAR) 25 MG tablet Take 75 mg by mouth daily. 04/28/22   [provider]  losartan (COZAAR) 50 MG tablet Take 50 mg by mouth daily. Patient not taking: Reported on 06/28/2022 06/07/18   [provider]  meloxicam (MOBIC) 15 MG tablet TAKE 1 TABLET BY MOUTH EVERY DAY. REPLACES DICLOFENAC 06/08/18   [provider]  pantoprazole (PROTONIX) 40 MG tablet Take 40 mg by mouth daily. 04/17/18   [provider]  phentermine (ADIPEX-P) 37.5 MG tablet Take 37.5 mg by mouth daily. 06/25/22   [provider]  simvastatin (ZOCOR) 20 MG tablet Take  20 mg by mouth daily. 04/05/18   [provider]  VYVANSE 50 MG capsule Take 50 mg by mouth daily. 06/13/22   [provider]  VYVANSE 70 MG capsule Take 70 mg by mouth daily. Patient not taking: Reported on 06/28/2022 05/13/18   [provider]  zolpidem (AMBIEN) 10 MG tablet Take 10 mg by mouth at bedtime. 06/03/22   [provider]  zolpidem (AMBIEN) 5 MG tablet TAKE 1 TABLET BY MOUTH AT BEDTIME Patient not taking: Reported on 06/28/2022 06/01/14   [provider]   Physical Exam: Vitals:   06/28/22 1038 06/28/22 1200 06/28/22 1230 06/28/22 1430  BP:  110/79 114/77 129/84  Pulse:  89 84 81  Resp:   17 20  Temp:   98.7 F (37.1 C)    TempSrc:   Oral   SpO2:  97% 96% 97%  Weight: 102.1 kg     Height: '5\' 7"'$  (1.702 m)      Constitutional: appears age-appropriate, NAD, calm, comfortable Eyes: PERRL, lids and conjunctivae normal ENMT: Mucous membranes are moist. Posterior pharynx clear of any exudate or lesions. Age-appropriate dentition. Hearing appropriate Neck: normal, supple, no masses, no thyromegaly Respiratory: clear to auscultation bilaterally, no wheezing, no crackles. Normal respiratory effort. No accessory muscle use.  Cardiovascular: Regular rate and rhythm, no murmurs / rubs / gallops. No extremity edema. 2+ pedal pulses. No carotid bruits.  Abdomen: no tenderness, no masses palpated, no hepatosplenomegaly. Bowel sounds positive.  Musculoskeletal: no clubbing / cyanosis. No joint deformity upper and lower extremities. Good ROM, no contractures, no atrophy. Normal muscle tone.  Skin: Left cheek pain and swelling Neurologic: Sensation intact. Strength 5/5 in all 4.  Psychiatric: Normal judgment and insight. Alert and oriented x 3. Normal mood.   EKG: Personally reviewed which showed sinus rhythm with rate of 82, QTc 461  Chest x-ray on Admission: Not indicated at this time  CT Maxillofacial W Contrast  Result Date: 06/28/2022 CLINICAL DATA:  Maxillofacial abscess.  Left-sided facial swelling EXAM: CT MAXILLOFACIAL WITH CONTRAST TECHNIQUE: Multidetector CT imaging of the maxillofacial structures was performed with intravenous contrast. Multiplanar CT image reconstructions were also generated. RADIATION DOSE REDUCTION: This exam was performed according to the departmental dose-optimization program which includes automated exposure control, adjustment of the mA and/or kV according to patient size and/or use of iterative reconstruction technique. CONTRAST:  49m OMNIPAQUE IOHEXOL 300 MG/ML  SOLN COMPARISON:  None Available. FINDINGS: Osseous: No fracture or mandibular dislocation. No destructive process. Orbits: Left  infraorbital and slight lateral periorbital soft tissue swelling is present without associated fluid collection. Bilateral lens replacements are noted. Globes and orbits are otherwise within normal limits. Sinuses: The paranasal sinuses and mastoid air cells are clear. Soft tissues: Left infraorbital and lateral periorbital soft tissue swelling is present. Slight asymmetric soft tissue swelling is present at the base of the nose. No discrete fluid collection is present. Facial soft tissues are otherwise within normal limits. Subcentimeter submandibular lymph nodes bilaterally are likely reactive. Limited intracranial: Within normal limits. IMPRESSION: 1. Left infraorbital and lateral periorbital soft tissue swelling without associated fluid collection. 2. Slight asymmetric soft tissue swelling at the base of the nose. 3. No discrete fluid collection. Electronically Signed   By: CSan MorelleM.D.   On: 06/28/2022 13:47    Labs on Admission: I have personally reviewed following labs  CBC: Recent Labs  Lab 06/28/22 1115  WBC 16.7*  NEUTROABS 13.4*  HGB 13.9  HCT 42.0  MCV 88.8  PLT 102   Basic Metabolic Panel: Recent Labs  Lab 06/28/22 1159  NA 139  K 3.7  CL 105  CO2 24  GLUCOSE 102*  BUN 17  CREATININE 0.86  CALCIUM 9.5   GFR: Estimated Creatinine Clearance: 84.4 mL/min (by C-G formula based on SCr of 0.86 mg/dL).  Liver Function Tests: Recent Labs  Lab 06/28/22 1159  AST 37  ALT 29  ALKPHOS 110  BILITOT 0.6  PROT 8.1  ALBUMIN 4.2   Urine analysis:    Component Value Date/Time   COLORURINE YELLOW (A) 06/28/2022 1115   APPEARANCEUR CLEAR (A) 06/28/2022 1115   LABSPEC 1.015 06/28/2022 1115   PHURINE 6.0 06/28/2022 1115   GLUCOSEU NEGATIVE 06/28/2022 1115   HGBUR NEGATIVE 06/28/2022 1115   BILIRUBINUR NEGATIVE 06/28/2022 1115   KETONESUR NEGATIVE 06/28/2022 1115   PROTEINUR NEGATIVE 06/28/2022 1115   NITRITE NEGATIVE 06/28/2022 1115   LEUKOCYTESUR NEGATIVE  06/28/2022 1115   Dr. Tobie Poet [facility]  If 7PM-7AM, please contact overnight-coverage provider If 7AM-7PM, please contact day coverage provider [email or web address]  06/28/2022, 4:24 PM

## 2022-06-28 NOTE — Progress Notes (Signed)
PHARMACIST - PHYSICIAN COMMUNICATION  CONCERNING:  Enoxaparin (Lovenox) for DVT Prophylaxis    RECOMMENDATION: Patient was prescribed enoxaprin '40mg'$  q24 hours for VTE prophylaxis.   Filed Weights   06/28/22 1038  Weight: 102.1 kg (225 lb)    Body mass index is 35.24 kg/m.  Estimated Creatinine Clearance: 84.4 mL/min (by C-G formula based on SCr of 0.86 mg/dL).   Based on [place] patient is candidate for enoxaparin 0.'5mg'$ /kg TBW SQ every 24 hours based on BMI being >30.   DESCRIPTION: Pharmacy has adjusted enoxaparin dose per [facility] policy.  Patient is now receiving enoxaparin 0.5 mg/kg every 24 hours    Dallie Piles, PharmD Clinical Pharmacist  06/28/2022 4:34 PM

## 2022-06-28 NOTE — Assessment & Plan Note (Signed)
-   Resumed home cyclobenzaprine 5 mg nightly

## 2022-06-28 NOTE — Assessment & Plan Note (Signed)
Failed outpatient therapy - Per patient she is status post amoxicillin and clindamycin outpatient without improvement - She is status post Zosyn and vancomycin per EDP - We will continue with vancomycin and cefepime - Admit to MedSurg, observation

## 2022-06-28 NOTE — Hospital Course (Addendum)
[name] is a 61 year old female with history of depression, anxiety, hypertension, GERD, hyperlipidemia, insomnia, presents to the emergency department for chief concerns of worsening left facial swelling.  Initial vitals in the emergency department showed temperature of 98, respiration rate of 18, heart rate of 104, SPO2 of 97% on room air, blood pressure 118/48.  Serum sodium is 139, potassium 3.7, chloride 105, bicarb 24, BUN of 17, serum creatinine of 0.86, GFR greater than 60, nonfasting blood glucose 102, WBC 16.7, hemoglobin 13.9, platelets of 308.  Lactic acid was 1.4.  CT maxillofacial with contrast was read as left intraoral orbital and lateral periorbital soft tissue swelling without associated fluid collection.  Slight asymmetric soft tissue swelling at the base of the nose.  No discrete fluid collection.  ED treatment: Acetaminophen 1 g IV, sodium chloride 1 L bolus, vancomycin, Zosyn IV per pharmacy.

## 2022-06-28 NOTE — Assessment & Plan Note (Signed)
-   Simvastatin 20 mg daily resumed 

## 2022-06-28 NOTE — Consult Note (Signed)
Pharmacy Antibiotic Note  Lauren Tyler is a 61 y.o. female admitted on 06/28/2022 with cellulitis. PMH significant for depression, anxiety, HTN, GERD, HLD, insomnia. Patient with facial abscess since 9/5 and no improvement on amoxicillin and clindamycin. WBC 16.7, VSS. Pharmacy has been consulted for Vancomycin and Cefepime dosing.  Plan: Day 1 of antibiotics Completed Vancomycin 2000 mg IV x1. Initiate Vancomycin 1000 mg IV Q12H. Goal AUC 400-550. Expected AUC: 454.6 Expected Css min: 13.7 SCr used: 0.86  Weight used: IBW, Vd used: 0.72 (BMI 35) Initiate Cefepime 2g Q8H Continue to monitor renal function and culture results   Height: '5\' 7"'$  (170.2 cm) Weight: 102.1 kg (225 lb) IBW/kg (Calculated) : 61.6  Temp (24hrs), Avg:98.4 F (36.9 C), Min:98 F (36.7 C), Max:98.7 F (37.1 C)  Recent Labs  Lab 06/28/22 1115 06/28/22 1159  WBC 16.7*  --   CREATININE  --  0.86  LATICACIDVEN 1.4  --     Estimated Creatinine Clearance: 84.4 mL/min (by C-G formula based on SCr of 0.86 mg/dL).    No Known Allergies  Antimicrobials this admission: 9/8 Zosyn x1 inED 9/8 Vancomycin >>  9/8 Cefepime >>   Dose adjustments this admission: N/A  Microbiology results: 9/8 BCx: in process  Thank you for allowing pharmacy to be a part of this patient's care.  Gretel Acre, PharmD PGY1 Pharmacy Resident 06/28/2022 3:07 PM

## 2022-06-28 NOTE — Assessment & Plan Note (Addendum)
-   Resumed home amlodipine 5 mg daily, losartan 75 mg daily - Hydralazine 10 mg daily every 6 hours as needed for SBP greater than 180, 4 days ordered

## 2022-06-28 NOTE — Consult Note (Signed)
PHARMACY -  BRIEF ANTIBIOTIC NOTE   Pharmacy has received consult(s) for Vancomycin from an ED provider.  The patient's profile has been reviewed for ht/wt/allergies/indication/available labs.    One time order(s) placed for Vancomycin 2g IV x1  Further antibiotics/pharmacy consults should be ordered by admitting physician if indicated.                       Thank you,  Gretel Acre, PharmD PGY1 Pharmacy Resident 06/28/2022 11:17 AM

## 2022-06-28 NOTE — Assessment & Plan Note (Signed)
-   Presumed secondary to cellulitis - Treat per cellulitis

## 2022-06-29 DIAGNOSIS — F32A Depression, unspecified: Secondary | ICD-10-CM | POA: Diagnosis present

## 2022-06-29 DIAGNOSIS — Z833 Family history of diabetes mellitus: Secondary | ICD-10-CM | POA: Diagnosis not present

## 2022-06-29 DIAGNOSIS — L03211 Cellulitis of face: Secondary | ICD-10-CM | POA: Diagnosis present

## 2022-06-29 DIAGNOSIS — G44209 Tension-type headache, unspecified, not intractable: Secondary | ICD-10-CM | POA: Diagnosis present

## 2022-06-29 DIAGNOSIS — E7800 Pure hypercholesterolemia, unspecified: Secondary | ICD-10-CM | POA: Diagnosis present

## 2022-06-29 DIAGNOSIS — F419 Anxiety disorder, unspecified: Secondary | ICD-10-CM | POA: Diagnosis present

## 2022-06-29 DIAGNOSIS — K219 Gastro-esophageal reflux disease without esophagitis: Secondary | ICD-10-CM | POA: Diagnosis present

## 2022-06-29 DIAGNOSIS — Z8249 Family history of ischemic heart disease and other diseases of the circulatory system: Secondary | ICD-10-CM | POA: Diagnosis not present

## 2022-06-29 DIAGNOSIS — Z90710 Acquired absence of both cervix and uterus: Secondary | ICD-10-CM | POA: Diagnosis not present

## 2022-06-29 DIAGNOSIS — E079 Disorder of thyroid, unspecified: Secondary | ICD-10-CM | POA: Diagnosis present

## 2022-06-29 DIAGNOSIS — F458 Other somatoform disorders: Secondary | ICD-10-CM | POA: Diagnosis present

## 2022-06-29 DIAGNOSIS — Z79899 Other long term (current) drug therapy: Secondary | ICD-10-CM | POA: Diagnosis not present

## 2022-06-29 DIAGNOSIS — Z9049 Acquired absence of other specified parts of digestive tract: Secondary | ICD-10-CM | POA: Diagnosis not present

## 2022-06-29 DIAGNOSIS — L0390 Cellulitis, unspecified: Secondary | ICD-10-CM | POA: Diagnosis present

## 2022-06-29 DIAGNOSIS — G4700 Insomnia, unspecified: Secondary | ICD-10-CM | POA: Diagnosis present

## 2022-06-29 DIAGNOSIS — I10 Essential (primary) hypertension: Secondary | ICD-10-CM | POA: Diagnosis present

## 2022-06-29 LAB — BASIC METABOLIC PANEL
Anion gap: 10 (ref 5–15)
BUN: 14 mg/dL (ref 8–23)
CO2: 23 mmol/L (ref 22–32)
Calcium: 8.8 mg/dL — ABNORMAL LOW (ref 8.9–10.3)
Chloride: 104 mmol/L (ref 98–111)
Creatinine, Ser: 0.7 mg/dL (ref 0.44–1.00)
GFR, Estimated: >60 mL/min (ref >60)
Glucose, Bld: 112 mg/dL — ABNORMAL HIGH (ref 70–99)
Potassium: 3.8 mmol/L (ref 3.5–5.1)
Sodium: 137 mmol/L (ref 135–145)

## 2022-06-29 LAB — CBC
HCT: 35.8 % — ABNORMAL LOW (ref 36.0–46.0)
Hemoglobin: 12.3 g/dL (ref 12.0–15.0)
MCH: 30.2 pg (ref 26.0–34.0)
MCHC: 34.4 g/dL (ref 30.0–36.0)
MCV: 88 fL (ref 80.0–100.0)
Platelets: 275 10*3/uL (ref 150–400)
RBC: 4.07 MIL/uL (ref 3.87–5.11)
RDW: 13.9 % (ref 11.5–15.5)
WBC: 12.5 10*3/uL — ABNORMAL HIGH (ref 4.0–10.5)
nRBC: 0 % (ref 0.0–0.2)

## 2022-06-29 MED ORDER — LISDEXAMFETAMINE DIMESYLATE 30 MG PO CAPS
30.0000 mg | ORAL_CAPSULE | Freq: Every day | ORAL | Status: DC
  Administered 2022-06-29 – 2022-06-30 (×2): 30 mg via ORAL
  Filled 2022-06-29 (×2): qty 1

## 2022-06-29 MED ORDER — DIPHENHYDRAMINE HCL 25 MG PO CAPS
25.0000 mg | ORAL_CAPSULE | Freq: Four times a day (QID) | ORAL | Status: DC | PRN
  Administered 2022-06-29 (×2): 25 mg via ORAL
  Filled 2022-06-29 (×2): qty 1

## 2022-06-29 MED ORDER — LISDEXAMFETAMINE DIMESYLATE 20 MG PO CAPS
20.0000 mg | ORAL_CAPSULE | Freq: Every day | ORAL | Status: DC
Start: 2022-06-29 — End: 2022-06-30
  Administered 2022-06-29 – 2022-06-30 (×2): 20 mg via ORAL
  Filled 2022-06-29 (×2): qty 1

## 2022-06-29 NOTE — Progress Notes (Signed)
PROGRESS NOTE    Lauren Tyler   NFA:[id number] DOB: [birth year]  DOA: 06/28/2022 Date of Service: 06/29/22 PCP: Ronita Hipps, MD     Brief Narrative / Hospital Course:  Lauren Tyler is a 61 year old female with history of depression, anxiety, hypertension, GERD, hyperlipidemia, insomnia, presents to the emergency department for chief concerns of worsening left facial swelling. She reports that she had a pimple in her left naris that started last week.  She endorses the pain and feels it is still there.  She reports on Tuesday, 06/25/2022, she developed swelling of her left cheek and left exterior nose with pain surrounding left side of her nose and her left cheek.  09/08: VSS, WBC 16.7. Lactic acid was 1.4. CT maxillofacial with contrast was read as left intraoral orbital and lateral periorbital soft tissue swelling without associated fluid collection.  Slight asymmetric soft tissue swelling at the base of the nose.  No discrete fluid collection. ED treatment: Acetaminophen 1 g IV, sodium chloride 1 L bolus, vancomycin, Zosyn IV per pharmacy.  09/09: WBC trending down to 12.5, VSS.  Per RN, patient was asking about possibly going home, she is reporting pain from IV insertion.  On rounds, patient appears anxious, not sure about going home, reports pain/swelling is improved compared to yesterday. Later in the day, reports pain is somewhat improved but still significant. WIll keep on abx through today/tomorrow AM and reevaluate    Consultants:  none  Procedures: none  Antimicrobials: Cefepime 2 g every 8 hours IV, received 1 dose 06/28/2022 at 17:52, scheduled for 4 doses/day 2 06/29/2022 Piperacillin-tazobactam 3.375 g IV x1 received 06/28/2022 -discontinued Vancomycin 2000 mg IV x1 received 1 dose at 1233 06/28/2022 then 1000 mg every 12 hours IV, received 1 dose 06/28/2022 at 2206, scheduled for 2 doses today/day 2 06/29/2022    ASSESSMENT & PLAN:   Principal Problem:    Cellulitis Active Problems:   Depression   Leukocytosis   Essential hypertension   Hyperlipidemia   Insomnia   Bruxism   Cellulitis - sepsis ruled out  Failed outpatient therapy. Per patient she is status post amoxicillin and clindamycin outpatient without improvement status post Zosyn and vancomycin per EDP continue with vancomycin and cefepime transition to inpatient and continue at least 1 more day IV antibiotics pending clinical course  Leukocytosis Presumed secondary to cellulitis Treat per cellulitis  Essential hypertension Resumed home amlodipine 5 mg daily, losartan 75 mg daily Hydralazine 10 mg daily every 6 hours as needed for SBP greater than 180, 4 days ordered  Hyperlipidemia Simvastatin 20 mg daily resumed  Depression Fluoxetine 10 mg daily resumed  Insomnia Resume zolpidem 10 mg nightly as needed for sleep  Bruxism Resumed home cyclobenzaprine 5 mg nightly    DVT prophylaxis: Enoxaparin Pertinent IV fluids/nutrition: No continuous IV fluids Central lines / invasive devices: None  Code Status: Full code Family Communication: Husband is at bedside on rounds  Disposition: transition to inpatient based on clinical course anticipate discharge home to previous home environment TOC needs: None Barriers to discharge / significant pending items: Continued IV antibiotics pending clinical improvement cellulitis             Subjective:  Patient reports pain is improved this afternoon but still significantly bothersome        Objective:  Vitals:   06/29/22 0006 06/29/22 0602 06/29/22 0744 06/29/22 1644  BP: 123/76 (!) 146/95 133/85 106/77  Pulse: 100 92 86 78  Resp: '18 20 17 '$ 17  Temp: 98.5 F (36.9 C) 98.7 F (37.1 C) 98.1 F (36.7 C) 98 F (36.7 C)  TempSrc:      SpO2: 96% 95% 95% 100%  Weight:      Height:        Intake/Output Summary (Last 24 hours) at 06/29/2022 1647 Last data filed at 06/29/2022 0426 Gross per 24 hour  Intake  400 ml  Output --  Net 400 ml   Filed Weights   06/28/22 1038  Weight: 102.1 kg    Examination:  Constitutional:  VS as above General Appearance: alert, well-developed, well-nourished, NAD Eyes: Normal lids and conjunctive, non-icteric sclera EOMI without pain on movement  Ears, Nose, Mouth, Throat: Redness/tender L of philthrum MMM Neck: No masses, trachea midline No lymphadenopathy Respiratory: Normal respiratory effort No wheeze No rhonchi No rales Cardiovascular: S1/S2 normal No murmur No rub/gallop auscultated No JVD No lower extremity edema Gastrointestinal: No tenderness No masses No hernia appreciated Musculoskeletal:  No clubbing/cyanosis of digits Symmetrical movement in all extremities Neurological: No cranial nerve deficit on limited exam Alert Psychiatric: Normal judgment/insight Normal mood and affect       Scheduled Medications:   amLODipine  5 mg Oral Daily   cyclobenzaprine  5 mg Oral QHS   enoxaparin (LOVENOX) injection  0.5 mg/kg Subcutaneous QHS   FLUoxetine  10 mg Oral Daily   lisdexamfetamine  20 mg Oral Daily   And   lisdexamfetamine  30 mg Oral Daily   losartan  75 mg Oral Daily   pantoprazole  40 mg Oral Daily   simvastatin  20 mg Oral q1800    Continuous Infusions:  ceFEPime (MAXIPIME) IV 2 g (06/29/22 0915)   lactated ringers     vancomycin 1,000 mg (06/29/22 1113)    PRN Medications:  acetaminophen **OR** acetaminophen, albuterol, diphenhydrAMINE, hydrALAZINE, morphine injection, ondansetron **OR** ondansetron (ZOFRAN) IV, oxyCODONE-acetaminophen, senna-docusate, zolpidem  Antimicrobials:  Anti-infectives (From admission, onward)    Start     Dose/Rate Route Frequency Ordered Stop   06/28/22 2200  vancomycin (VANCOCIN) IVPB 1000 mg/200 mL premix        1,000 mg 200 mL/hr over 60 Minutes Intravenous Every 12 hours 06/28/22 1513     06/28/22 1800  ceFEPIme (MAXIPIME) 2 g in sodium chloride 0.9 % 100 mL IVPB         2 g 200 mL/hr over 30 Minutes Intravenous Every 8 hours 06/28/22 1513     06/28/22 1230  vancomycin (VANCOREADY) IVPB 2000 mg/400 mL        2,000 mg 200 mL/hr over 120 Minutes Intravenous  Once 06/28/22 1120 06/28/22 1604   06/28/22 1115  piperacillin-tazobactam (ZOSYN) IVPB 3.375 g        3.375 g 100 mL/hr over 30 Minutes Intravenous  Once 06/28/22 1113 06/28/22 1235       Data Reviewed: I have personally reviewed following labs and imaging studies  CBC: Recent Labs  Lab 06/28/22 1115 06/29/22 0638  WBC 16.7* 12.5*  NEUTROABS 13.4*  --   HGB 13.9 12.3  HCT 42.0 35.8*  MCV 88.8 88.0  PLT 308 324   Basic Metabolic Panel: Recent Labs  Lab 06/28/22 1159 06/29/22 0638  NA 139 137  K 3.7 3.8  CL 105 104  CO2 24 23  GLUCOSE 102* 112*  BUN 17 14  CREATININE 0.86 0.70  CALCIUM 9.5 8.8*   GFR: Estimated Creatinine Clearance: 90.7 mL/min (by C-G formula based on SCr of 0.7 mg/dL). Liver Function Tests: Recent Labs  Lab 06/28/22 1159  AST 37  ALT 29  ALKPHOS 110  BILITOT 0.6  PROT 8.1  ALBUMIN 4.2   No results for input(s): "LIPASE", "AMYLASE" in the last 168 hours. No results for input(s): "AMMONIA" in the last 168 hours. Coagulation Profile: No results for input(s): "INR", "PROTIME" in the last 168 hours. Cardiac Enzymes: No results for input(s): "CKTOTAL", "CKMB", "CKMBINDEX", "TROPONINI" in the last 168 hours. BNP (last 3 results) No results for input(s): "PROBNP" in the last 8760 hours. HbA1C: No results for input(s): "HGBA1C" in the last 72 hours. CBG: No results for input(s): "GLUCAP" in the last 168 hours. Lipid Profile: No results for input(s): "CHOL", "HDL", "LDLCALC", "TRIG", "CHOLHDL", "LDLDIRECT" in the last 72 hours. Thyroid Function Tests: No results for input(s): "TSH", "T4TOTAL", "FREET4", "T3FREE", "THYROIDAB" in the last 72 hours. Anemia Panel: No results for input(s): "VITAMINB12", "FOLATE", "FERRITIN", "TIBC", "IRON", "RETICCTPCT" in  the last 72 hours. Urine analysis:    Component Value Date/Time   COLORURINE YELLOW (A) 06/28/2022 1115   APPEARANCEUR CLEAR (A) 06/28/2022 1115   LABSPEC 1.015 06/28/2022 1115   PHURINE 6.0 06/28/2022 1115   GLUCOSEU NEGATIVE 06/28/2022 1115   HGBUR NEGATIVE 06/28/2022 1115   BILIRUBINUR NEGATIVE 06/28/2022 1115   KETONESUR NEGATIVE 06/28/2022 1115   PROTEINUR NEGATIVE 06/28/2022 1115   NITRITE NEGATIVE 06/28/2022 1115   LEUKOCYTESUR NEGATIVE 06/28/2022 1115   Sepsis Labs: '@LABRCNTIP'$ (procalcitonin:4,lacticidven:4)  Recent Results (from the past 240 hour(s))  Blood Culture (routine x 2)     Status: None (Preliminary result)   Collection Time: 06/28/22 11:15 AM   Specimen: BLOOD  Result Value Ref Range Status   Specimen Description BLOOD RIGHT ARM  Final   Special Requests   Final    BOTTLES DRAWN AEROBIC AND ANAEROBIC Blood Culture results may not be optimal due to an inadequate volume of blood received in culture bottles   Culture   Final    NO GROWTH < 24 HOURS Performed at [facility], [address] [place], [place] [address]    Report Status PENDING  Incomplete  Blood Culture (routine x 2)     Status: None (Preliminary result)   Collection Time: 06/28/22 11:15 AM   Specimen: BLOOD  Result Value Ref Range Status   Specimen Description BLOOD LEFT ARM  Final   Special Requests   Final    BOTTLES DRAWN AEROBIC AND ANAEROBIC Blood Culture adequate volume   Culture   Final    NO GROWTH < 24 HOURS Performed at [facility], [address], [place], [place] [address]    Report Status PENDING  Incomplete         Radiology Studies: CT Maxillofacial W Contrast  Result Date: 06/28/2022 CLINICAL DATA:  Maxillofacial abscess.  Left-sided facial swelling EXAM: CT MAXILLOFACIAL WITH CONTRAST TECHNIQUE: Multidetector CT imaging of the maxillofacial structures was performed with intravenous contrast. Multiplanar CT image reconstructions were also  generated. RADIATION DOSE REDUCTION: This exam was performed according to the departmental dose-optimization program which includes automated exposure control, adjustment of the mA and/or kV according to patient size and/or use of iterative reconstruction technique. CONTRAST:  48m OMNIPAQUE IOHEXOL 300 MG/ML  SOLN COMPARISON:  None Available. FINDINGS: Osseous: No fracture or mandibular dislocation. No destructive process. Orbits: Left infraorbital and slight lateral periorbital soft tissue swelling is present without associated fluid collection. Bilateral lens replacements are noted. Globes and orbits are otherwise within normal limits. Sinuses: The paranasal sinuses and mastoid air cells are clear. Soft tissues:  Left infraorbital and lateral periorbital soft tissue swelling is present. Slight asymmetric soft tissue swelling is present at the base of the nose. No discrete fluid collection is present. Facial soft tissues are otherwise within normal limits. Subcentimeter submandibular lymph nodes bilaterally are likely reactive. Limited intracranial: Within normal limits. IMPRESSION: 1. Left infraorbital and lateral periorbital soft tissue swelling without associated fluid collection. 2. Slight asymmetric soft tissue swelling at the base of the nose. 3. No discrete fluid collection. Electronically Signed   By: San Morelle M.D.   On: 06/28/2022 13:47            LOS: 0 days       Emeterio Reeve, DO [facility] 06/29/2022, 4:47 PM   Staff may message me via secure chat in [place]  but this may not receive immediate response,  please page for urgent matters!  If 7PM-7AM, please contact night-coverage [email or web address]  Dictation software was used to generate the above note. Typos may occur and escape review, as with typed/written notes. Please contact Dr Sheppard Coil directly for clarity if needed.

## 2022-06-29 NOTE — Progress Notes (Signed)
Brief Progress Note Uncertain for discharge later today versus transition to inpatient and keep overnight.  Will update to full progress note versus discharge summary    Lauren Tyler is a 61 year old female with history of depression, anxiety, hypertension, GERD, hyperlipidemia, insomnia, presents to the emergency department for chief concerns of worsening left facial swelling. She reports that she had a pimple in her left naris that started last week.  She endorses the pain and feels it is still there.  She reports on Tuesday, 06/25/2022, she developed swelling of her left cheek and left exterior nose with pain surrounding left side of her nose and her left cheek.  09/08: VSS, WBC 16.7. Lactic acid was 1.4. CT maxillofacial with contrast was read as left intraoral orbital and lateral periorbital soft tissue swelling without associated fluid collection.  Slight asymmetric soft tissue swelling at the base of the nose.  No discrete fluid collection. ED treatment: Acetaminophen 1 g IV, sodium chloride 1 L bolus, vancomycin, Zosyn IV per pharmacy.  09/09: WBC trending down to 12.5, VSS.  Per RN, patient was asking about possibly going home, she is reporting pain from IV insertion.  On rounds, patient appears anxious, not sure about going home, reports pain/swelling is improved compared to yesterday   Consultants:  none  Procedures: none  Antimicrobials: Cefepime 2 g every 8 hours IV, received 1 dose 06/28/2022 at 17:52, scheduled for 4 doses/day 2 06/29/2022 Piperacillin-tazobactam 3.375 g IV x1 received 06/28/2022 -discontinued Vancomycin 2000 mg IV x1 received 1 dose at 1233 06/28/2022 then 1000 mg every 12 hours IV, received 1 dose 06/28/2022 at 2206, scheduled for 2 doses today/day 2 06/30/2019.    ASSESSMENT & PLAN:   Principal Problem:   Cellulitis Active Problems:   Depression   Leukocytosis   Essential hypertension   Hyperlipidemia   Insomnia   Bruxism   Cellulitis - sepsis ruled  out  Failed outpatient therapy. Per patient she is status post amoxicillin and clindamycin outpatient without improvement status post Zosyn and vancomycin per EDP continue with vancomycin and cefepime If patient is improving and pain controlled, may consider late discharge today.  If not improving/worse, will transition to inpatient and continue at least 1 more day IV antibiotics pending clinical course  Leukocytosis Presumed secondary to cellulitis Treat per cellulitis  Essential hypertension Resumed home amlodipine 5 mg daily, losartan 75 mg daily Hydralazine 10 mg daily every 6 hours as needed for SBP greater than 180, 4 days ordered  Hyperlipidemia Simvastatin 20 mg daily resumed  Depression Fluoxetine 10 mg daily resumed  Insomnia Resume zolpidem 10 mg nightly as needed for sleep  Bruxism Resumed home cyclobenzaprine 5 mg nightly    DVT prophylaxis: Enoxaparin Pertinent IV fluids/nutrition: No continuous IV fluids Central lines / invasive devices: None  Code Status: Full code Family Communication: Husband is at bedside on rounds  Disposition: Observation status, may need to transition to inpatient based on clinical course anticipate discharge home to previous home environment TOC needs: None Barriers to discharge / significant pending items: Continued IV antibiotics pending clinical improvement cellulitis      Objective: Physical Exam:  BP 133/85 (BP Location: Left Arm)   Pulse 86   Temp 98.1 F (36.7 C)   Resp 17   Ht '5\' 7"'$  (1.702 m)   Wt 102.1 kg   SpO2 95%   BMI 35.24 kg/m  Constitutional:  General Appearance: alert, well-developed, well-nourished, NAD Respiratory: Normal respiratory effort Breath sounds normal, no wheeze/rhonchi/rales Cardiovascular: S1/S2  normal, no murmur/rub/gallop auscultated No lower extremity edema Gastrointestinal: Nontender, no masses Musculoskeletal:  No clubbing/cyanosis of digits Neurological: No cranial nerve  deficit on limited exam Psychiatric: Normal judgment/insight Anxious mood and affect

## 2022-06-29 NOTE — Plan of Care (Signed)

## 2022-06-29 NOTE — Plan of Care (Signed)
  Problem: Education: Goal: Knowledge of General Education information will improve Description: Including pain rating scale, medication(s)/side effects and non-pharmacologic comfort measures Outcome: Progressing   Problem: Health Behavior/Discharge Planning: Goal: Ability to manage health-related needs will improve Outcome: Progressing   Problem: Clinical Measurements: Goal: Ability to maintain clinical measurements within normal limits will improve Outcome: Progressing   Problem: Clinical Measurements: Goal: Will remain free from infection Outcome: Progressing   Problem: Clinical Measurements: Goal: Respiratory complications will improve Outcome: Progressing   Problem: Clinical Measurements: Goal: Cardiovascular complication will be avoided Outcome: Progressing   Problem: Activity: Goal: Risk for activity intolerance will decrease Outcome: Progressing   Problem: Nutrition: Goal: Adequate nutrition will be maintained Outcome: Progressing   Problem: Coping: Goal: Level of anxiety will decrease Outcome: Progressing   Problem: Pain Managment: Goal: General experience of comfort will improve Outcome: Progressing   Problem: Safety: Goal: Ability to remain free from injury will improve Outcome: Progressing   Problem: Skin Integrity: Goal: Risk for impaired skin integrity will decrease Outcome: Progressing

## 2022-06-30 DIAGNOSIS — I10 Essential (primary) hypertension: Secondary | ICD-10-CM | POA: Diagnosis not present

## 2022-06-30 DIAGNOSIS — L03211 Cellulitis of face: Secondary | ICD-10-CM | POA: Diagnosis not present

## 2022-06-30 DIAGNOSIS — F458 Other somatoform disorders: Secondary | ICD-10-CM | POA: Diagnosis not present

## 2022-06-30 DIAGNOSIS — F32A Depression, unspecified: Secondary | ICD-10-CM | POA: Diagnosis not present

## 2022-06-30 LAB — HIV ANTIBODY (ROUTINE TESTING W REFLEX): HIV Screen 4th Generation wRfx: NONREACTIVE

## 2022-06-30 MED ORDER — SULFAMETHOXAZOLE-TRIMETHOPRIM 800-160 MG PO TABS: 1.0000 | ORAL_TABLET | Freq: Once | ORAL | Status: DC

## 2022-06-30 MED ORDER — SULFAMETHOXAZOLE-TRIMETHOPRIM 800-160 MG PO TABS: 1.0000 | ORAL_TABLET | Freq: Two times a day (BID) | ORAL | 0 refills | Status: AC

## 2022-06-30 MED ORDER — DIPHENHYDRAMINE HCL 25 MG PO CAPS: 25.0000 mg | ORAL_CAPSULE | Freq: Four times a day (QID) | ORAL | 0 refills | Status: AC | PRN

## 2022-06-30 MED ORDER — HYDROCODONE-ACETAMINOPHEN 5-325 MG PO TABS: 1.0000 | ORAL_TABLET | Freq: Four times a day (QID) | ORAL | 0 refills | Status: AC | PRN

## 2022-06-30 MED ORDER — SULFAMETHOXAZOLE-TRIMETHOPRIM 800-160 MG PO TABS
1.0000 | ORAL_TABLET | Freq: Once | ORAL | Status: AC
Start: 2022-06-30 — End: 2022-06-30
  Administered 2022-06-30: 1 via ORAL
  Filled 2022-06-30: qty 1

## 2022-06-30 NOTE — Discharge Summary (Signed)
Physician Discharge Summary   Patient: Lauren Tyler MRN: [id number]  DOB: [birth year]   Admit:     Date of Admission: 06/28/2022 Admitted from: home   Discharge: Date of discharge: 06/30/22 Disposition: Home Condition at discharge: good  CODE STATUS: FULL     Discharge Physician: Emeterio Reeve, DO [facility]     PCP: Ronita Hipps, MD  Recommendations for Outpatient Follow-up:  Follow up with PCP Ronita Hipps, MD in 1-2 weeks Please follow up on the following pending results: none PCP AND OTHER OUTPATIENT PROVIDERS: SEE BELOW FOR SPECIFIC DISCHARGE INSTRUCTIONS PRINTED FOR PATIENT IN ADDITION TO GENERIC AVS PATIENT INFO    Discharge Instructions     Call MD for:  severe uncontrolled pain   Complete by: As directed    Call MD for:  temperature >100.4   Complete by: As directed    Diet - low sodium heart healthy   Complete by: As directed    Discharge instructions   Complete by: As directed    Finish antibiotics No restrictions on activity  If worse please seek medical care asap!   Increase activity slowly   Complete by: As directed          Discharge Diagnoses: Principal Problem:   Cellulitis Active Problems:   Depression   Leukocytosis   Essential hypertension   Hyperlipidemia   Insomnia   Bruxism       Hospital Course: [name] is a 61 year old female with history of depression, anxiety, hypertension, GERD, hyperlipidemia, insomnia, presents to the emergency department for chief concerns of worsening left facial swelling. She reports that she had a pimple in her left naris that started last week.  She endorses the pain and feels it is still there.  She reports on Tuesday, 06/25/2022, she developed swelling of her left cheek and left exterior nose with pain surrounding left side of her nose and her left cheek.  09/08: VSS, WBC 16.7. Lactic acid was 1.4. CT maxillofacial with contrast was read as left intraoral  orbital and lateral periorbital soft tissue swelling without associated fluid collection.  Slight asymmetric soft tissue swelling at the base of the nose.  No discrete fluid collection. ED treatment: Acetaminophen 1 g IV, sodium chloride 1 L bolus, vancomycin, Zosyn IV per pharmacy.  09/09: WBC trending down to 12.5, VSS.  Per RN, patient was asking about possibly going home, she is reporting pain from IV insertion.  On rounds, patient appears anxious, not sure about going home, reports pain/swelling is improved compared to yesterday. Later in the day, reports pain is somewhat improved but still significant. WIll keep on abx through today/tomorrow AM and reevaluate  09/10: Patient reports pain is significantly improved, on exam skin is no longer erythematous/tender.  Patient discharged on p.o. antibiotics and clinic/ER precautions were reviewed, all questions answered.   Consultants:  none  Procedures: none  Antimicrobials: Cefepime 2 g every 8 hours IV, received 1 dose 06/28/2022 at 17:52, scheduled for 4 doses/day 2 06/29/2022 Piperacillin-tazobactam 3.375 g IV x1 received 06/28/2022 -discontinued Vancomycin 2000 mg IV x1 received 1 dose at 1233 06/28/2022 then 1000 mg every 12 hours IV, received 1 dose 06/28/2022 at 2206, scheduled for 2 doses today/day 2 06/29/2022    ASSESSMENT & PLAN:   Principal Problem:   Cellulitis Active Problems:   Depression   Leukocytosis   Essential hypertension   Hyperlipidemia   Insomnia   Bruxism   Cellulitis - sepsis ruled out -  improving Failed outpatient therapy. Per patient she is status post amoxicillin and clindamycin outpatient without improvement status post Zosyn and vancomycin per EDP continue with vancomycin and cefepime Transitioned to Bactrim on discharge  Leukocytosis -improving Presumed secondary to cellulitis Treat per cellulitis  Essential hypertension -stable Resumed home amlodipine 5 mg daily, losartan 75 mg daily Hydralazine 10 mg  daily every 6 hours as needed for SBP greater than 180, 4 days ordered  Hyperlipidemia -stable Simvastatin 20 mg daily resumed  Depression -stable Fluoxetine 10 mg daily resumed  Insomnia -stable Resume zolpidem 10 mg nightly as needed for sleep  Bruxism - stable Resumed home cyclobenzaprine 5 mg nightly              Discharge Instructions  Allergies as of 06/30/2022   No Known Allergies      Medication List     STOP taking these medications    amitriptyline 25 MG tablet Commonly known as: ELAVIL   CALCIUM + D PO       TAKE these medications    albuterol 108 (90 Base) MCG/ACT inhaler Commonly known as: VENTOLIN HFA Inhale 2 puffs into the lungs every 4 (four) hours.   amLODipine 5 MG tablet Commonly known as: NORVASC Take 5 mg by mouth daily.   Azelastine-Fluticasone 137-50 MCG/ACT Susp Commonly known as: Dymista Use one spray in each nostril twice daily as directed   cyclobenzaprine 5 MG tablet Commonly known as: FLEXERIL cyclobenzaprine 5 mg tablet  TAKE 1 (ONE) TABLET AT BEDTIME FOR JAW CLENCHING   diphenhydrAMINE 25 mg capsule Commonly known as: BENADRYL Take 1 capsule (25 mg total) by mouth every 6 (six) hours as needed for itching or allergies.   FLUoxetine 10 MG capsule Commonly known as: PROZAC Take 10 mg by mouth daily.   fluticasone 50 MCG/ACT nasal spray Commonly known as: FLONASE fluticasone propionate 50 mcg/actuation nasal spray,suspension  USE 2 SPRAYS BY NASAL ROUTE DAILY FOR 30 DAYS.   HYDROcodone-acetaminophen 5-325 MG tablet Commonly known as: Norco Take 1 tablet by mouth every 6 (six) hours as needed for severe pain.   ipratropium 0.06 % nasal spray Commonly known as: ATROVENT Use two sprays in each nostril every 6 hours if needed to dry up nose   losartan 25 MG tablet Commonly known as: COZAAR Take 75 mg by mouth daily. What changed: Another medication with the same name was removed. Continue taking this  medication, and follow the directions you see here.   meloxicam 15 MG tablet Commonly known as: MOBIC TAKE 1 TABLET BY MOUTH EVERY DAY. REPLACES DICLOFENAC   pantoprazole 40 MG tablet Commonly known as: PROTONIX Take 40 mg by mouth daily.   phentermine 37.5 MG tablet Commonly known as: ADIPEX-P Take 37.5 mg by mouth daily.   simvastatin 20 MG tablet Commonly known as: ZOCOR Take 20 mg by mouth daily.   sulfamethoxazole-trimethoprim 800-160 MG tablet Commonly known as: BACTRIM DS Take 1 tablet by mouth 2 (two) times daily. Take first dose afternoon/evening 06/30/2022   Vyvanse 50 MG capsule Generic drug: lisdexamfetamine Take 50 mg by mouth daily. What changed: Another medication with the same name was removed. Continue taking this medication, and follow the directions you see here.   zolpidem 10 MG tablet Commonly known as: AMBIEN Take 10 mg by mouth at bedtime. What changed: Another medication with the same name was removed. Continue taking this medication, and follow the directions you see here.          No Known  Allergies   Subjective: Patient feeling well this morning, pain is improved, though not totally resolved.  No nausea/vomiting, no headache/vision change, no pain with eye movement, normal neck range of motion   Discharge Exam: BP (!) 141/79 (BP Location: Right Arm)   Pulse 84   Temp 98.2 F (36.8 C) (Oral)   Resp 17   Ht '5\' 7"'$  (1.702 m)   Wt 102.1 kg   SpO2 96%   BMI 35.24 kg/m  General: Pt is alert, awake, not in acute distress Skin: Erythema below nose into the left is improved compared to yesterday,\ Cardiovascular: RRR, S1/S2 +, no rubs, no gallops Respiratory: CTA bilaterally, no wheezing, no rhonchi Abdominal: Soft, NT, ND, bowel sounds + Extremities: no edema, no cyanosis     The results of significant diagnostics from this hospitalization (including imaging, microbiology, ancillary and laboratory) are listed below for reference.      Microbiology: Recent Results (from the past 240 hour(s))  Blood Culture (routine x 2)     Status: None (Preliminary result)   Collection Time: 06/28/22 11:15 AM   Specimen: BLOOD  Result Value Ref Range Status   Specimen Description BLOOD RIGHT ARM  Final   Special Requests   Final    BOTTLES DRAWN AEROBIC AND ANAEROBIC Blood Culture results may not be optimal due to an inadequate volume of blood received in culture bottles   Culture   Final    NO GROWTH 2 DAYS Performed at [facility], [address], [place], [place] [address]    Report Status PENDING  Incomplete  Blood Culture (routine x 2)     Status: None (Preliminary result)   Collection Time: 06/28/22 11:15 AM   Specimen: BLOOD  Result Value Ref Range Status   Specimen Description BLOOD LEFT ARM  Final   Special Requests   Final    BOTTLES DRAWN AEROBIC AND ANAEROBIC Blood Culture adequate volume   Culture   Final    NO GROWTH 2 DAYS Performed at [facility], [address] [place], [place] [address]    Report Status PENDING  Incomplete     Labs: BNP (last 3 results) No results for input(s): "BNP" in the last 8760 hours. Basic Metabolic Panel: Recent Labs  Lab 06/28/22 1159 06/29/22 0638  NA 139 137  K 3.7 3.8  CL 105 104  CO2 24 23  GLUCOSE 102* 112*  BUN 17 14  CREATININE 0.86 0.70  CALCIUM 9.5 8.8*   Liver Function Tests: Recent Labs  Lab 06/28/22 1159  AST 37  ALT 29  ALKPHOS 110  BILITOT 0.6  PROT 8.1  ALBUMIN 4.2   No results for input(s): "LIPASE", "AMYLASE" in the last 168 hours. No results for input(s): "AMMONIA" in the last 168 hours. CBC: Recent Labs  Lab 06/28/22 1115 06/29/22 0638  WBC 16.7* 12.5*  NEUTROABS 13.4*  --   HGB 13.9 12.3  HCT 42.0 35.8*  MCV 88.8 88.0  PLT 308 275   Cardiac Enzymes: No results for input(s): "CKTOTAL", "CKMB", "CKMBINDEX", "TROPONINI" in the last 168 hours. BNP: Invalid input(s): "POCBNP" CBG: No results for  input(s): "GLUCAP" in the last 168 hours. D-Dimer No results for input(s): "DDIMER" in the last 72 hours. Hgb A1c No results for input(s): "HGBA1C" in the last 72 hours. Lipid Profile No results for input(s): "CHOL", "HDL", "LDLCALC", "TRIG", "CHOLHDL", "LDLDIRECT" in the last 72 hours. Thyroid function studies No results for input(s): "TSH", "T4TOTAL", "T3FREE", "THYROIDAB" in the last 72  hours.  Invalid input(s): "FREET3" Anemia work up No results for input(s): "VITAMINB12", "FOLATE", "FERRITIN", "TIBC", "IRON", "RETICCTPCT" in the last 72 hours. Urinalysis    Component Value Date/Time   COLORURINE YELLOW (A) 06/28/2022 1115   APPEARANCEUR CLEAR (A) 06/28/2022 1115   LABSPEC 1.015 06/28/2022 1115   PHURINE 6.0 06/28/2022 1115   GLUCOSEU NEGATIVE 06/28/2022 1115   HGBUR NEGATIVE 06/28/2022 1115   BILIRUBINUR NEGATIVE 06/28/2022 1115   KETONESUR NEGATIVE 06/28/2022 1115   PROTEINUR NEGATIVE 06/28/2022 1115   NITRITE NEGATIVE 06/28/2022 1115   LEUKOCYTESUR NEGATIVE 06/28/2022 1115   Sepsis Labs Recent Labs  Lab 06/28/22 1115 06/29/22 0638  WBC 16.7* 12.5*   Microbiology Recent Results (from the past 240 hour(s))  Blood Culture (routine x 2)     Status: None (Preliminary result)   Collection Time: 06/28/22 11:15 AM   Specimen: BLOOD  Result Value Ref Range Status   Specimen Description BLOOD RIGHT ARM  Final   Special Requests   Final    BOTTLES DRAWN AEROBIC AND ANAEROBIC Blood Culture results may not be optimal due to an inadequate volume of blood received in culture bottles   Culture   Final    NO GROWTH 2 DAYS Performed at [facility], [address], [place], [place] [address]    Report Status PENDING  Incomplete  Blood Culture (routine x 2)     Status: None (Preliminary result)   Collection Time: 06/28/22 11:15 AM   Specimen: BLOOD  Result Value Ref Range Status   Specimen Description BLOOD LEFT ARM  Final   Special Requests   Final    BOTTLES  DRAWN AEROBIC AND ANAEROBIC Blood Culture adequate volume   Culture   Final    NO GROWTH 2 DAYS Performed at [facility], [address], [place], [place] [address]    Report Status PENDING  Incomplete   Imaging CT Maxillofacial W Contrast  Result Date: 06/28/2022 CLINICAL DATA:  Maxillofacial abscess.  Left-sided facial swelling EXAM: CT MAXILLOFACIAL WITH CONTRAST TECHNIQUE: Multidetector CT imaging of the maxillofacial structures was performed with intravenous contrast. Multiplanar CT image reconstructions were also generated. RADIATION DOSE REDUCTION: This exam was performed according to the departmental dose-optimization program which includes automated exposure control, adjustment of the mA and/or kV according to patient size and/or use of iterative reconstruction technique. CONTRAST:  59m OMNIPAQUE IOHEXOL 300 MG/ML  SOLN COMPARISON:  None Available. FINDINGS: Osseous: No fracture or mandibular dislocation. No destructive process. Orbits: Left infraorbital and slight lateral periorbital soft tissue swelling is present without associated fluid collection. Bilateral lens replacements are noted. Globes and orbits are otherwise within normal limits. Sinuses: The paranasal sinuses and mastoid air cells are clear. Soft tissues: Left infraorbital and lateral periorbital soft tissue swelling is present. Slight asymmetric soft tissue swelling is present at the base of the nose. No discrete fluid collection is present. Facial soft tissues are otherwise within normal limits. Subcentimeter submandibular lymph nodes bilaterally are likely reactive. Limited intracranial: Within normal limits. IMPRESSION: 1. Left infraorbital and lateral periorbital soft tissue swelling without associated fluid collection. 2. Slight asymmetric soft tissue swelling at the base of the nose. 3. No discrete fluid collection. Electronically Signed   By: CSan MorelleM.D.   On: 06/28/2022 13:47      Time  coordinating discharge: over 30 minutes  SIGNED:  NEmeterio ReeveDO [facility]

## 2022-06-30 NOTE — Plan of Care (Signed)
Patient discharged per MD orders at this time.All discharge instructions,education & medications reviewed with the patient.Pt expressed understanding and will comply with dc instructions.follow up appointments was also communicated to the patient.no verbal c/o or any ssx of distress.Pt was discharged home with self care per order.Pt was transported home by spouse in a privately owned vehicle.

## 2022-07-03 LAB — CULTURE, BLOOD (ROUTINE X 2)
Culture: NO GROWTH
Culture: NO GROWTH
Special Requests: ADEQUATE

## 2023-06-20 ENCOUNTER — Other Ambulatory Visit: Payer: Self-pay

## 2023-06-20 ENCOUNTER — Encounter (HOSPITAL_COMMUNITY): Payer: Self-pay

## 2023-06-20 ENCOUNTER — Emergency Department (HOSPITAL_COMMUNITY)
Admission: EM | Admit: 2023-06-20 | Discharge: 2023-06-20 | Disposition: A | Discharge status: Home / Self Care | Payer: Managed Care, Other (non HMO) | Attending: Emergency Medicine | Admitting: Emergency Medicine

## 2023-06-20 DIAGNOSIS — I10 Essential (primary) hypertension: Secondary | ICD-10-CM | POA: Insufficient documentation

## 2023-06-20 DIAGNOSIS — H5712 Ocular pain, left eye: Secondary | ICD-10-CM | POA: Diagnosis present

## 2023-06-20 DIAGNOSIS — B0052 Herpesviral keratitis: Secondary | ICD-10-CM | POA: Diagnosis not present

## 2023-06-20 DIAGNOSIS — Z79899 Other long term (current) drug therapy: Secondary | ICD-10-CM | POA: Insufficient documentation

## 2023-06-20 MED ORDER — TRIFLURIDINE 1 % OP SOLN
1.0000 [drp] | OPHTHALMIC | Status: DC
  Administered 2023-06-20: 1 [drp] via OPHTHALMIC
  Filled 2023-06-20 (×2): qty 7.5

## 2023-06-20 MED ORDER — TRIFLURIDINE 1 % OP SOLN: 1.0000 [drp] | OPHTHALMIC | Status: AC

## 2023-06-20 MED ORDER — CIPROFLOXACIN HCL 0.3 % OP SOLN: 1.0000 [drp] | OPHTHALMIC | Status: AC

## 2023-06-20 MED ORDER — CIPROFLOXACIN HCL 0.3 % OP SOLN
1.0000 [drp] | OPHTHALMIC | Status: DC
  Administered 2023-06-20: 1 [drp] via OPHTHALMIC
  Filled 2023-06-20: qty 2.5

## 2023-06-20 MED ORDER — FLUORESCEIN SODIUM 1 MG OP STRP
ORAL_STRIP | OPHTHALMIC | Status: AC
  Filled 2023-06-20: qty 1

## 2023-06-20 MED ORDER — TETRACAINE HCL 0.5 % OP SOLN
1.0000 [drp] | Freq: Once | OPHTHALMIC | Status: AC
  Administered 2023-06-20: 2 [drp] via OPHTHALMIC
  Filled 2023-06-20: qty 4

## 2023-06-20 NOTE — Discharge Instructions (Addendum)
We evaluated you for your eye pain.  We discussed your symptoms and your physical exam findings with Dr. Genia Del, the on-call ophthalmologist.  He recommends starting an antiviral and antibacterial eyedrop.  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  We have given you these in the emergency department.  Please use each drop once every 4 hours.  He would like to follow-up with you in his clinic next week.  Please call his office to schedule an appointment next week.  Please take Tylenol and Motrin for your symptoms at home.  You can take 1000 mg of Tylenol every 6 hours and 600 mg of ibuprofen every 6 hours as needed for your symptoms.  You can take these medicines together as needed, either at the same time, or alternating every 3 hours.  If you have any new or worsening symptoms such as loss of vision, severe uncontrolled pain, severe headaches, nausea or vomiting, fevers or chills, or any other new symptoms, please return to the emergency department.

## 2023-06-20 NOTE — ED Triage Notes (Signed)
Pt came in via POV from her eye dr's office d/t concern for acute glaucoma. Pt reports for the past 4 weeks she has been having a 6/10 pain that she feels behind her Lt eye (mainly in the outer corner of the eye). States that it has been progressively been getting worse & now sees a halo around lights in the Lt eye. Her glasses are not working appropriately as they usually do as well. She went in to her eye drs office & they sent here here for eval. Does reports some pain when she looks Lt & Rt, but not very bad. Rt eye is fine but her Lt eye vision appears like if you had 2 of the same things that are beside each other & slightly overlapping in the middle like they cannot line up is what her vision looks like in that eye. A/Ox4.

## 2023-06-20 NOTE — ED Notes (Signed)
..  The patient is A&OX4, ambulatory at d/c with independent steady gait, NAD. Pt verbalized understanding of d/c instructions, prescriptions and follow up care.  

## 2023-06-20 NOTE — ED Provider Notes (Signed)
[place] [facility] AT [facility] Provider Note  CSN: [id number] Arrival date & time: 06/20/23 1746  Chief Complaint(s) Vision Changes and Lt eye Pain  HPI Lauren Tyler is a 62 y.o. female history of hyperlipidemia, hypertension presenting to the emergency department with left eye pain.  She reports that she has had symptoms for around a week which have been worsening.  She called her family medicine doctor who advised that she should come to the emergency department for evaluation.  She has not yet seen an ophthalmologist or optometrist.  She reports some blurry vision in the left eye and some visual disturbance..  No diplopia.  No drainage.  No sudden pain or foreign body sensation.  Does not wear contact lenses.  Reports prior cataract surgery.  No trauma to the eye.  No numbness, tingling, weakness.   Past Medical History Past Medical History:  Diagnosis Date   GERD (gastroesophageal reflux disease)    Hypercholesteremia    Insomnia    Thyroid disorder    Patient Active Problem List   Diagnosis Date Noted   Cellulitis 06/28/2022   Depression 06/28/2022   Leukocytosis 06/28/2022   Essential hypertension 06/28/2022   Hyperlipidemia 06/28/2022   Insomnia 06/28/2022   Bruxism 06/28/2022   Home Medication(s) Prior to Admission medications   Medication Sig Start Date End Date Taking? Authorizing Provider  ciprofloxacin (CILOXAN) 0.3 % ophthalmic solution Place 1 drop into the left eye every 4 (four) hours. Administer 1 drop, every 2 hours, while awake, for 2 days. Then 1 drop, every 4 hours, while awake, for the next 5 days. 06/20/23  Yes Lonell Grandchild, MD  trifluridine (VIROPTIC) 1 % ophthalmic solution Place 1 drop into the left eye every 4 (four) hours. 06/20/23  Yes Lonell Grandchild, MD  albuterol (VENTOLIN HFA) 108 (90 Base) MCG/ACT inhaler Inhale 2 puffs into the lungs every 4 (four) hours. 06/15/22   [provider]  amLODipine  (NORVASC) 5 MG tablet Take 5 mg by mouth daily. 06/02/18   [provider]  Azelastine-Fluticasone (DYMISTA) 137-50 MCG/ACT SUSP Use one spray in each nostril twice daily as directed 07/22/18   Marcelyn Bruins, MD  cyclobenzaprine (FLEXERIL) 5 MG tablet cyclobenzaprine 5 mg tablet  TAKE 1 (ONE) TABLET AT BEDTIME FOR JAW CLENCHING    [provider]  diphenhydrAMINE (BENADRYL) 25 mg capsule Take 1 capsule (25 mg total) by mouth every 6 (six) hours as needed for itching or allergies. 06/30/22   Sunnie Nielsen, DO  FLUoxetine (PROZAC) 10 MG capsule Take 10 mg by mouth daily. 04/01/18   [provider]  fluticasone (FLONASE) 50 MCG/ACT nasal spray fluticasone propionate 50 mcg/actuation nasal spray,suspension  USE 2 SPRAYS BY NASAL ROUTE DAILY FOR 30 DAYS. 01/06/18   [provider]  HYDROcodone-acetaminophen (NORCO) 5-325 MG tablet Take 1 tablet by mouth every 6 (six) hours as needed for severe pain. 06/30/22   Sunnie Nielsen, DO  ipratropium (ATROVENT) 0.06 % nasal spray Use two sprays in each nostril every 6 hours if needed to dry up nose 06/16/18   Marcelyn Bruins, MD  losartan (COZAAR) 25 MG tablet Take 75 mg by mouth daily. 04/28/22   [provider]  meloxicam (MOBIC) 15 MG tablet TAKE 1 TABLET BY MOUTH EVERY DAY. REPLACES DICLOFENAC 06/08/18   [provider]  pantoprazole (PROTONIX) 40 MG tablet Take 40 mg by mouth daily. 04/17/18   [provider]  phentermine (ADIPEX-P) 37.5 MG tablet Take  37.5 mg by mouth daily. 06/25/22   [provider]  simvastatin (ZOCOR) 20 MG tablet Take 20 mg by mouth daily. 04/05/18   [provider]  sulfamethoxazole-trimethoprim (BACTRIM DS) 800-160 MG tablet Take 1 tablet by mouth 2 (two) times daily. Take first dose afternoon/evening 06/30/2022 06/30/22   Sunnie Nielsen, DO  VYVANSE 50 MG capsule Take 50 mg by mouth daily. 06/13/22   [provider]  zolpidem  (AMBIEN) 10 MG tablet Take 10 mg by mouth at bedtime. 06/03/22   [provider]                                                                                                                                    Past Surgical History Past Surgical History:  Procedure Laterality Date   APPENDECTOMY     CHOLECYSTECTOMY     OOPHORECTOMY Left    pelvic floor reconstruction     SINOSCOPY     VAGINAL HYSTERECTOMY     Family History Family History  Problem Relation Age of Onset   Diabetes Father    Congestive Heart Failure Father    Hypertension Father    Hypertension Maternal Grandfather    Hypertension Paternal Grandmother    Diabetes Paternal Grandmother    Hypertension Paternal Grandfather    Allergic rhinitis Neg Hx    Asthma Neg Hx     Social History Social History   Tobacco Use   Smoking status: Never   Smokeless tobacco: Never   Allergies Patient has no known allergies.  Review of Systems Review of Systems  All other systems reviewed and are negative.   Physical Exam Vital Signs  I have reviewed the triage vital signs BP (!) 161/93 (BP Location: Right Arm)   Pulse 76   Temp 98.4 F (36.9 C) (Oral)   Resp 17   Ht 5\' 7"  (1.702 m)   Wt 88.5 kg   SpO2 100%   BMI 30.54 kg/m  Physical Exam Vitals and nursing note reviewed.  Constitutional:      Appearance: Normal appearance.  HENT:     Head: Normocephalic and atraumatic.     Mouth/Throat:     Mouth: Mucous membranes are moist.  Eyes:     Comments: PERRL, EOMI, mild left conjunctival injection.  Fluorescein exam with small linear area of uptake on the left cornea.  IOP 14 on left and 16 on right.  Lids swept with no foreign body  Cardiovascular:     Rate and Rhythm: Normal rate.  Pulmonary:     Effort: Pulmonary effort is normal. No respiratory distress.  Abdominal:     General: Abdomen is flat.  Musculoskeletal:        General: No deformity.  Skin:    General: Skin is warm and dry.      Capillary Refill: Capillary refill takes less than 2 seconds.  Neurological:     General: No focal  deficit present.     Mental Status: She is alert. Mental status is at baseline.     Comments: Cranial nerves II through XII intact  Psychiatric:        Mood and Affect: Mood normal.        Behavior: Behavior normal.     ED Results and Treatments Labs (all labs ordered are listed, but only abnormal results are displayed) Labs Reviewed - No data to display                                                                                                                        Radiology No results found.  Pertinent labs & imaging results that were available during my care of the patient were reviewed by me and considered in my medical decision making (see MDM for details).  Medications Ordered in ED Medications  trifluridine (VIROPTIC) 1 % ophthalmic solution 1 drop (1 drop Left Eye Given 06/20/23 2037)  ciprofloxacin (CILOXAN) 0.3 % ophthalmic solution 1 drop (1 drop Left Eye Given 06/20/23 2037)  tetracaine (PONTOCAINE) 0.5 % ophthalmic solution 1-2 drop (2 drops Both Eyes Given by Other 06/20/23 1837)  fluorescein 1 MG ophthalmic strip (  Given 06/20/23 1847)                                                                                                                                     Procedures Procedures  (including critical care time)  Medical Decision Making / ED Course   MDM:  62 year old female presenting to the emergency department with eye pain.  Patient examination with small linear area of uptake on left cornea, otherwise reassuring, normal ocular pressure and reactive pupil, doubt acute angle-closure glaucoma or glaucoma.  No foreign body patient denies foreign body sensation.  She reports her pain resolved with tetracaine drops which is reassuring for any posterior process or optic nerve process such as optic neuritis.  Her visual acuity is reassuring and only slightly  worse on the left.  Evidence of open globe, negative Seidel sign.  Discussed with on-call ophthalmologist Dr. Genia Del who believes constellation of physical exam findings of symptoms is most consistent with HSV keratitis and recommends Viroptic and ciprofloxacin drops.  Patient given these in the emergency department, Dr. Genia Del will follow-up with patient in clinic next week.  Patient comfortable with the plan.  Discussed home pain control.  Patient declined narcotic  prescription.     Medicines ordered and prescription drug management: Meds ordered this encounter  Medications   tetracaine (PONTOCAINE) 0.5 % ophthalmic solution 1-2 drop   fluorescein 1 MG ophthalmic strip    Maudie Mercury A: cabinet override   trifluridine (VIROPTIC) 1 % ophthalmic solution 1 drop   ciprofloxacin (CILOXAN) 0.3 % ophthalmic solution 1 drop   ciprofloxacin (CILOXAN) 0.3 % ophthalmic solution    Sig: Place 1 drop into the left eye every 4 (four) hours. Administer 1 drop, every 2 hours, while awake, for 2 days. Then 1 drop, every 4 hours, while awake, for the next 5 days.   trifluridine (VIROPTIC) 1 % ophthalmic solution    Sig: Place 1 drop into the left eye every 4 (four) hours.    -I have reviewed the patients home medicines and have made adjustments as needed   Consultations Obtained: I requested consultation with the opthalmologist,  and discussed lab and imaging findings as well as pertinent plan - they recommend: viroptic and cipro   Reevaluation: After the interventions noted above, I reevaluated the patient and found that their symptoms have improved  Co morbidities that complicate the patient evaluation  Past Medical History:  Diagnosis Date   GERD (gastroesophageal reflux disease)    Hypercholesteremia    Insomnia    Thyroid disorder       Dispostion: Disposition decision including need for hospitalization was considered, and patient discharged from emergency department.    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  Despite best efforts to proofread,  errors can occur which can change the documentation meaning.    Lonell Grandchild, MD 06/20/23 2122
